# Patient Record
Sex: Female | Born: 1950 | Race: White | Hispanic: No | Marital: Married | Smoking: Never smoker
Health system: Southern US, Community
[De-identification: ages and names within clinical notes are randomized; demographics above are authoritative.]

## PROBLEM LIST (undated history)

## (undated) DIAGNOSIS — I773 Arterial fibromuscular dysplasia: Secondary | ICD-10-CM

## (undated) DIAGNOSIS — F329 Major depressive disorder, single episode, unspecified: Secondary | ICD-10-CM

## (undated) DIAGNOSIS — E7800 Pure hypercholesterolemia, unspecified: Secondary | ICD-10-CM

## (undated) DIAGNOSIS — K219 Gastro-esophageal reflux disease without esophagitis: Secondary | ICD-10-CM

## (undated) DIAGNOSIS — R079 Chest pain, unspecified: Secondary | ICD-10-CM

## (undated) DIAGNOSIS — M810 Age-related osteoporosis without current pathological fracture: Secondary | ICD-10-CM

## (undated) DIAGNOSIS — I10 Essential (primary) hypertension: Secondary | ICD-10-CM

## (undated) DIAGNOSIS — E0590 Thyrotoxicosis, unspecified without thyrotoxic crisis or storm: Secondary | ICD-10-CM

## (undated) DIAGNOSIS — M1990 Unspecified osteoarthritis, unspecified site: Secondary | ICD-10-CM

## (undated) DIAGNOSIS — F32A Depression, unspecified: Secondary | ICD-10-CM

## (undated) HISTORY — DX: Chest pain, unspecified: R07.9

## (undated) HISTORY — DX: Thyrotoxicosis, unspecified without thyrotoxic crisis or storm: E05.90

## (undated) HISTORY — PX: UPPER GI ENDOSCOPY: SHX6162

## (undated) HISTORY — PX: CARDIAC CATHETERIZATION: SHX172

## (undated) HISTORY — DX: Arterial fibromuscular dysplasia: I77.3

## (undated) HISTORY — PX: ABDOMINAL HYSTERECTOMY: SHX81

## (undated) HISTORY — DX: Pure hypercholesterolemia, unspecified: E78.00

## (undated) HISTORY — DX: Age-related osteoporosis without current pathological fracture: M81.0

## (undated) HISTORY — PX: APPENDECTOMY: SHX54

---
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
## 1998-11-02 ENCOUNTER — Other Ambulatory Visit: Admission: RE | Admit: 1998-11-02 | Discharge: 1998-11-02 | Payer: Self-pay | Admitting: Obstetrics and Gynecology

## 2000-08-19 ENCOUNTER — Inpatient Hospital Stay (HOSPITAL_COMMUNITY): Admission: RE | Admit: 2000-08-19 | Discharge: 2000-08-20 | Payer: Self-pay | Admitting: Obstetrics and Gynecology

## 2004-08-12 HISTORY — PX: ORIF WRIST FRACTURE: SHX2133

## 2006-10-10 ENCOUNTER — Encounter: Admission: RE | Admit: 2006-10-10 | Discharge: 2006-10-10 | Payer: Self-pay | Admitting: Cardiology

## 2006-11-10 ENCOUNTER — Encounter: Admission: RE | Admit: 2006-11-10 | Discharge: 2006-11-10 | Payer: Self-pay | Admitting: Gastroenterology

## 2007-04-24 ENCOUNTER — Ambulatory Visit (HOSPITAL_COMMUNITY): Admission: RE | Admit: 2007-04-24 | Discharge: 2007-04-24 | Payer: Self-pay | Admitting: Cardiology

## 2007-08-13 HISTORY — PX: OTHER SURGICAL HISTORY: SHX169

## 2008-10-19 ENCOUNTER — Encounter: Admission: RE | Admit: 2008-10-19 | Discharge: 2008-10-19 | Payer: Self-pay | Admitting: Obstetrics and Gynecology

## 2009-01-04 IMAGING — CT CT ANGIO ABDOMEN
4 of 8 series · 11 of 46 positions shown, 18 images · IV contrast ([ID] OMNI 350)
Comparison: None.

CLINICAL DATA: 55-year-old female, benign renovascular hypertension.  
CT ANGIOGRAPHY OF ABDOMEN:
TECHNIQUE: Multidetector CT imaging of the abdomen was performed during bolus injection of intravenous contrast.  Multiplanar CT angiographic image reconstructions were generated to evaluate the vascular anatomy.
Contrast:  125 cc Omnipaque 350

[Series 5: recon 2: renal/sma cta · axial · 0.64mm/px · z∈[-234,-94]mm · 5 of 119 slices shown, 10 images]
[im 20/119  soft-tissue]
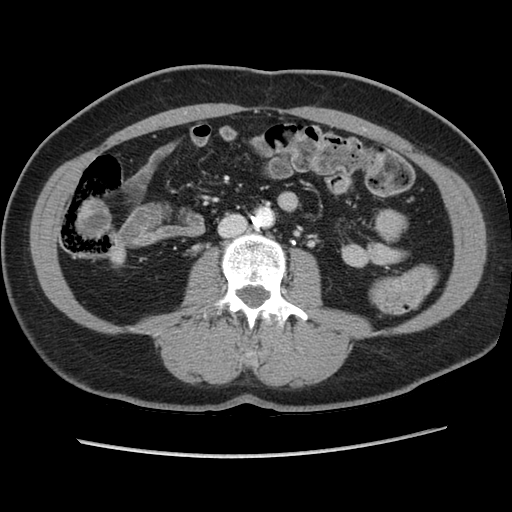
[im 20/119  bone]
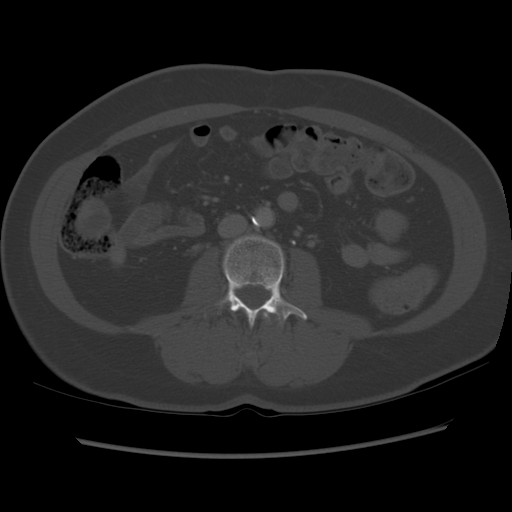
[im 40/119  soft-tissue]
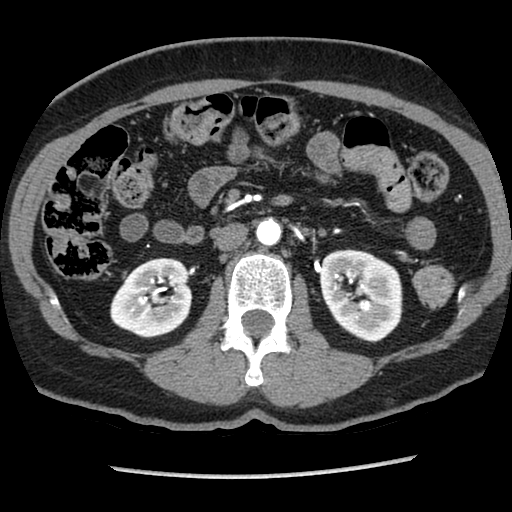
[im 40/119  lung]
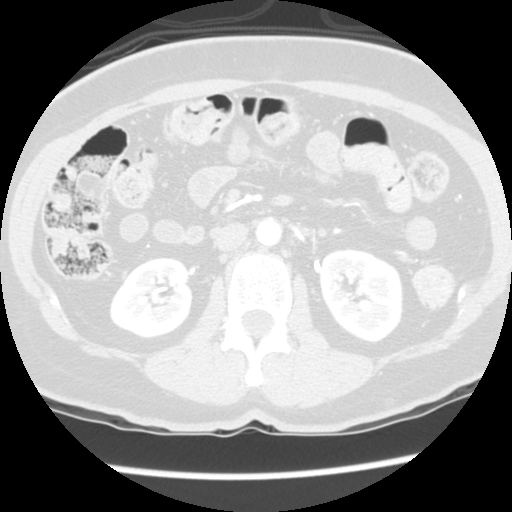
[im 60/119  soft-tissue]
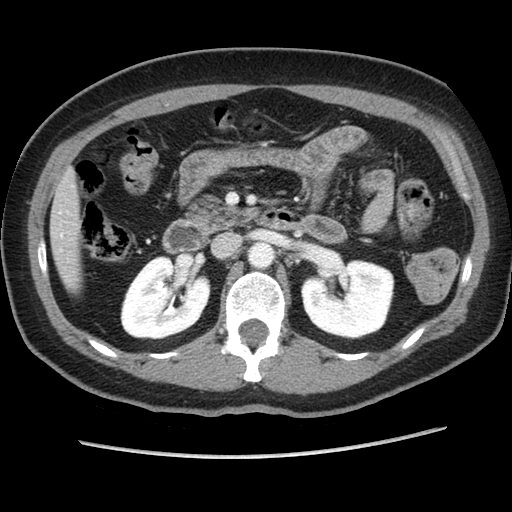
[im 60/119  lung]
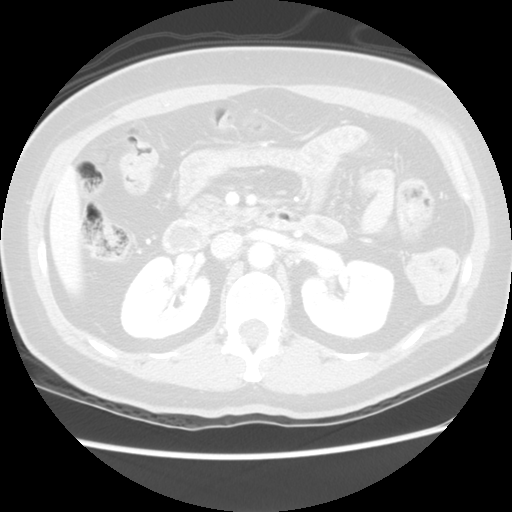
[im 79/119  soft-tissue]
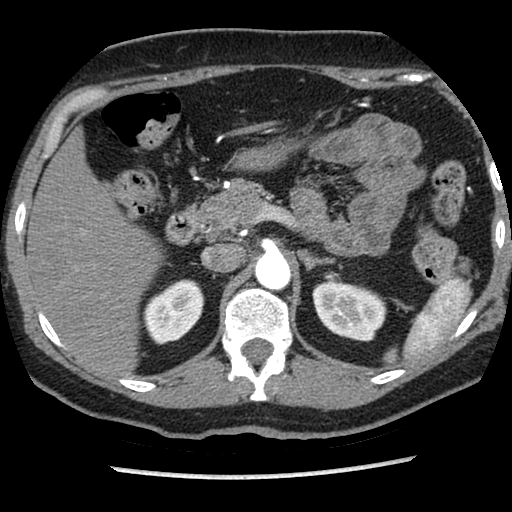
[im 79/119  lung]
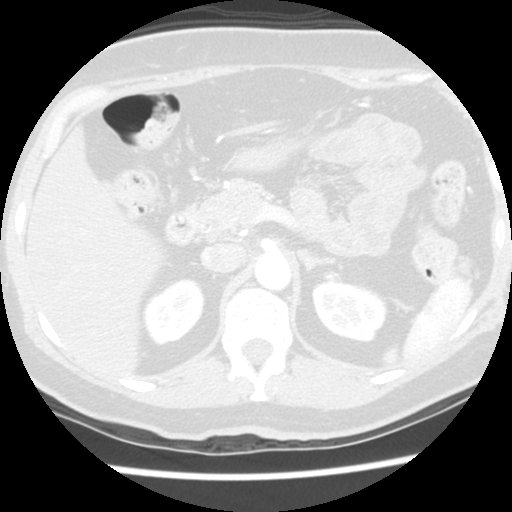
[im 99/119  soft-tissue]
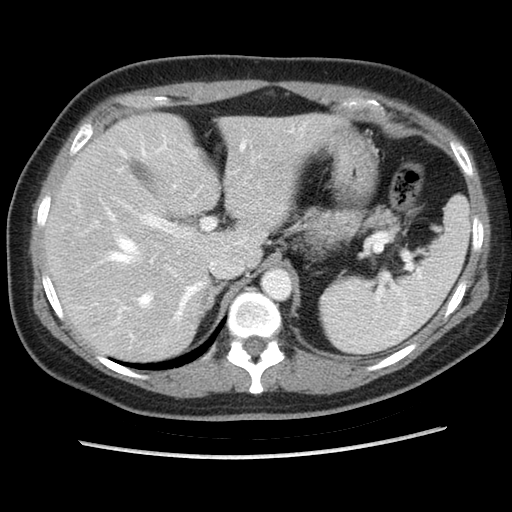
[im 99/119  lung]
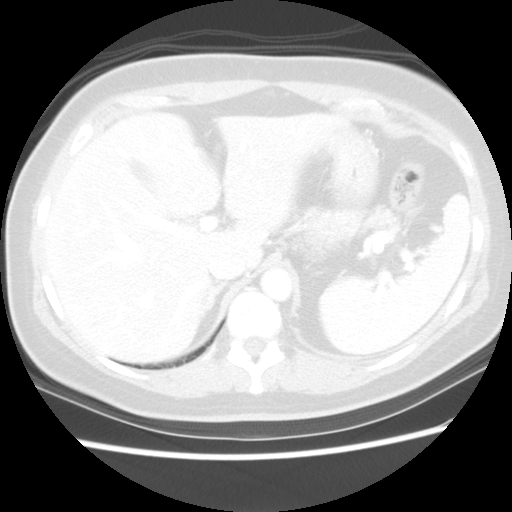

[Series 100: renal/sma cta · axial · 0.59mm/px · z∈[-244,-221]mm · 2 of 300 slices shown]
[im 19/300  soft-tissue]
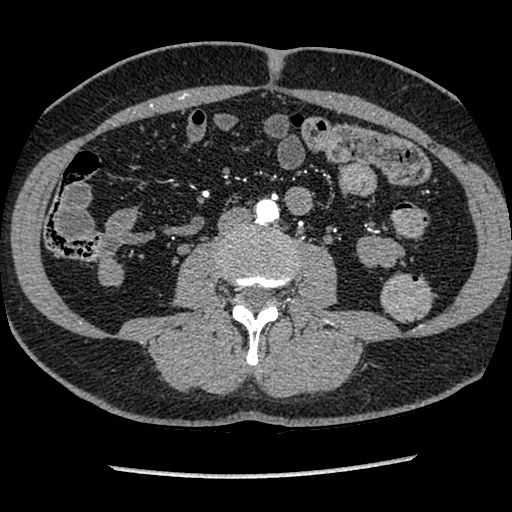
[im 57/300  soft-tissue]
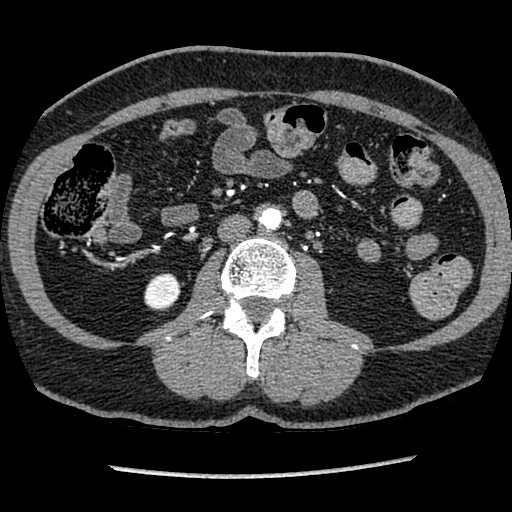

[Series 601: coronal angio · coronal · 0.59mm/px · 1 of 100 slices shown, 2 images]
[im 34/100  soft-tissue]
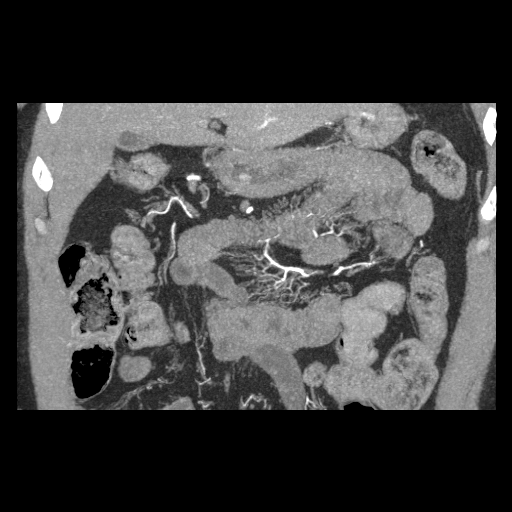
[im 34/100  bone]
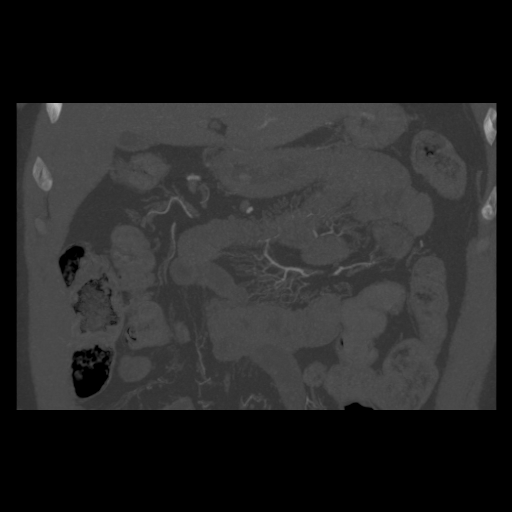

[Series 602: sagittal angio · sagittal · 0.59mm/px · 3 of 121 slices shown, 4 images]
[im 41/121  soft-tissue]
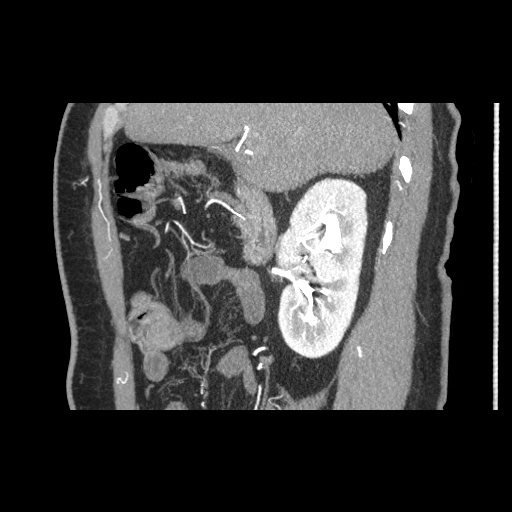
[im 54/121  soft-tissue]
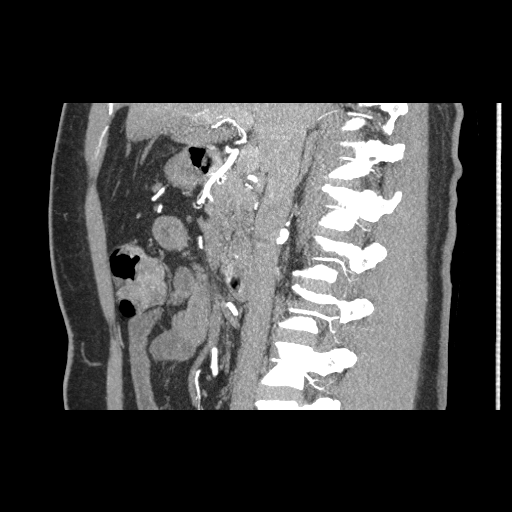
[im 54/121  bone]
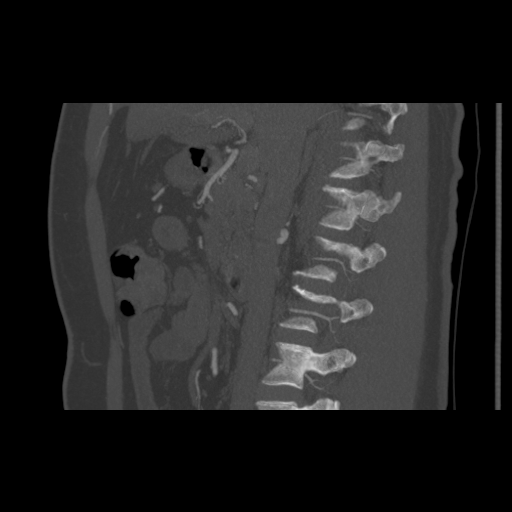
[im 67/121  soft-tissue]
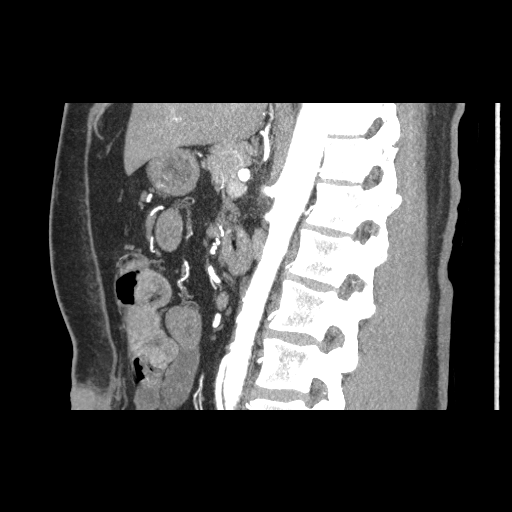

[11 of 46 positions shown; findings below may reference images not displayed]

FINDINGS: The abdominal aorta demonstrates minimal atherosclerotic calcification of the infrarenal segment extending to the bifurcation but no aneurysm, dissection, stenosis, or occlusion.  The celiac, superior mesenteric, and inferior mesenteric visceral arteries are all patent.  The superior mesenteric artery has a replaced right hepatic artery proximally, this is a normal variant.  
There are four total renal arteries, two on each side.  On the right side, there is a very tiny accessory right upper pole renal artery which is patent.  The main right renal artery has a patent ostium.  No proximal or ostial stenosis.  More distally, the main right renal artery has a ?minimally dilated and beaded? appearance extending to the bifurcation of the anterior and posterior divisions consistent with fibromuscular dysplasia of the right renal artery.  On the left side, the left main renal artery has a patent ostium with no proximal or ostial stenosis.  There is very subtle wall thickening and nodularity of the left renal artery main segment suspicious also for left FMD to a lesser degree.  There also is a lower pole left renal artery which is patent.  
The kidneys are symmetric in size with preserved cortex.  No acute hydronephrosis or obstruction.  No renal calculi appreciated.
Additional axial imaging of the abdomen demonstrates a 5 mm punctate hypodensity in the right hepatic dome, image 10 of series 5 too small to definitively characterize.  The gallbladder is partially collapsed.  The biliary system, adrenal glands, spleen, and pancreas are normal.  No bowel obstruction, abdominal adenopathy, free fluid, fluid collection, hemorrhage, or free air.  Degenerative change is present in the thoracic spine.  On the venous phase, the renal veins and IVC are patent.  Mesenteric and portal veins are patent.
IMPRESSION: 1.  Right renal artery fibromuscular dysplasia without a proximal or ostial stenosis.  
2.  Suspect left renal FMD as well to a lesser degree.  
3.  Patent celiac, superior mesenteric, and inferior mesenteric arteries. 
4.  Patent accessory renal vasculature bilaterally as described.
5.  Minimal aortic atherosclerosis but no aneurysm. 
6.   Punctate right hepatic hypodensity too small to definitively characterize.

## 2009-11-13 ENCOUNTER — Encounter: Admission: RE | Admit: 2009-11-13 | Discharge: 2009-11-13 | Payer: Self-pay | Admitting: Obstetrics and Gynecology

## 2010-09-02 ENCOUNTER — Encounter: Payer: Self-pay | Admitting: Gastroenterology

## 2010-09-04 ENCOUNTER — Ambulatory Visit
Admission: RE | Admit: 2010-09-04 | Payer: Self-pay | Source: Home / Self Care | Attending: Cardiology | Admitting: Cardiology

## 2010-09-09 NOTE — Procedures (Signed)
Alison [name], Alison [name] NO.:  [id number]  MEDICAL RECORD NO.:  [id number]          PATIENT TYPE:  OIB  LOCATION:  1966                         FACILITY:  [facility]  PHYSICIAN:  Armanda Magic, M.D.     DATE OF BIRTH:  1950-10-15  DATE OF PROCEDURE:  09/04/2010 DATE OF DISCHARGE:  09/04/2010                           CARDIAC CATHETERIZATION   REFERRING PHYSICIAN:  C. Duane Lope, MD  PROCEDURE:  Left heart catheterization, coronary angiography, left ventriculography.  OPERATOR:  Armanda Magic, MD  INDICATIONS:  Chest pain.  COMPLICATIONS:  None.  IV ACCESS:  Via right femoral artery, 4-French sheath.  INTRAVENOUS MEDICATIONS:  Versed 1 mg, fentanyl 25 mcg.  This is a 60 year old female with a history of hypertension, dyslipidemia, and chronic atypical chest pain with normal nuclear stress test a year ago, who presents with recurrent chest pain.  She recently had a health screen done with an EKG which was normal, 2-D echocardiogram was normal, and abdominal aorta and carotid arteries were normal.  She complains of persistent chest pain, described as a sensing up sensation in her midsternal area and radiates into her back between her shoulder blades and radiates down her left arm.  These symptoms have continued despite a normal nuclear stress test a year ago.  She says she is under a tremendous amount of stress.  She now presents for cardiac catheterization.  The patient was brought to the cardiac catheterization laboratory in the fasting nonsedated state.  Informed consent was obtained.  The patient was connected to continuous heart rate, pulse oximetry monitoring, intermittent blood pressure monitor.  The right groin was prepped and draped in sterile fashion.  Xylocaine 1% was used for local anesthesia. Using a modified Seldinger technique, a 4-French sheath was placed in right femoral artery.  Under fluoroscopic guidance, a 4-French JR-4 catheter was placed in  the right coronary artery.  Multiple cine films were taken in 30-degree RAO and 40-degree LAO views.  This catheter was then exchanged out over a guidewire for a 4-French 3D RCA catheter which successfully engaged the right coronary ostium.  Multiple cine films were taken in 30-degree RAO and 40-degree LAO views.  This catheter was then exchanged out over a guidewire for a 4-French angled pigtail catheter which was placed under fluoroscopic guidance in the left ventricular cavity.  Left ventriculography was performed in the 30- degree RAO view using a total of 0.5 mL contrast at 12 mL per second. The catheter was then pulled back across the aortic valve with no significant gradient noted.  At the end of the procedure, all catheters and sheaths were removed.  Manual compression was performed until adequate hemostasis was obtained.  The patient was transferred back to room in stable condition.  RESULTS:  The left main coronary artery is widely patent and trifurcates in the left anterior descending artery, left circumflex artery, andramus.  The ramus is a large artery which is widely patent.  The left anterior descending artery is widely patent throughout the course of the apex. It gives rise to 2 small-to-moderate-sized diagonals which were widely patent.  The left circumflex was widely  patent throughout the course of the AV groove, terminating in a large obtuse marginal branch.  Right coronary artery is widely patent throughout its course and distally bifurcates into posterior descending artery and posterolateral artery, both of which are widely patent.  Left ventriculography shows normal LV function, actually hyperdynamic with EF 70%, LV pressure 117/10 mmHg, aortic pressure 105/63 mmHg, LVEDP 16 mmHg.  ASSESSMENT: 1. Normal coronary arteries. 2. Normal left ventricular function. 3. Noncardiac chest pain.  PLAN:  Discharge to home after IV fluid and bedrest.  Follow up with  my nurse practitioner in 2 weeks for groin check.  Will follow up with her primary MD in 1-2 weeks for further workup of noncardiac chest pain.     Armanda Magic, M.D.     TT/MEDQ  D:  09/04/2010  T:  09/05/2010  Job:  [id number]  cc:   C. Duane Lope, M.D.  Electronically Signed by Armanda Magic M.D. on 09/09/2010 08:11:48 AM

## 2010-12-21 ENCOUNTER — Other Ambulatory Visit: Payer: Self-pay | Admitting: Family Medicine

## 2010-12-21 DIAGNOSIS — Z1231 Encounter for screening mammogram for malignant neoplasm of breast: Secondary | ICD-10-CM

## 2010-12-28 ENCOUNTER — Ambulatory Visit
Admission: RE | Admit: 2010-12-28 | Discharge: 2010-12-28 | Disposition: A | Discharge status: Home / Self Care | Payer: BC Managed Care – PPO | Source: Ambulatory Visit | Attending: Family Medicine | Admitting: Family Medicine

## 2010-12-28 DIAGNOSIS — Z1231 Encounter for screening mammogram for malignant neoplasm of breast: Secondary | ICD-10-CM

## 2011-12-13 ENCOUNTER — Other Ambulatory Visit: Payer: Self-pay | Admitting: Neurosurgery

## 2011-12-16 ENCOUNTER — Encounter (HOSPITAL_COMMUNITY): Payer: Self-pay | Admitting: Pharmacy Technician

## 2011-12-17 ENCOUNTER — Inpatient Hospital Stay (HOSPITAL_COMMUNITY): Admission: RE | Admit: 2011-12-17 | Payer: BC Managed Care – PPO | Source: Ambulatory Visit

## 2011-12-19 ENCOUNTER — Other Ambulatory Visit (HOSPITAL_COMMUNITY): Payer: BC Managed Care – PPO

## 2011-12-20 ENCOUNTER — Encounter (HOSPITAL_COMMUNITY)
Admission: RE | Admit: 2011-12-20 | Discharge: 2011-12-20 | Disposition: A | Discharge status: Home / Self Care | Payer: BC Managed Care – PPO | Source: Ambulatory Visit | Attending: Neurosurgery | Admitting: Neurosurgery

## 2011-12-20 ENCOUNTER — Ambulatory Visit (HOSPITAL_COMMUNITY)
Admission: RE | Admit: 2011-12-20 | Discharge: 2011-12-20 | Disposition: A | Discharge status: Home / Self Care | Payer: BC Managed Care – PPO | Source: Ambulatory Visit | Attending: Anesthesiology | Admitting: Anesthesiology

## 2011-12-20 ENCOUNTER — Encounter (HOSPITAL_COMMUNITY): Payer: Self-pay

## 2011-12-20 DIAGNOSIS — Z01818 Encounter for other preprocedural examination: Secondary | ICD-10-CM | POA: Insufficient documentation

## 2011-12-20 DIAGNOSIS — IMO0002 Reserved for concepts with insufficient information to code with codable children: Secondary | ICD-10-CM | POA: Insufficient documentation

## 2011-12-20 HISTORY — DX: Essential (primary) hypertension: I10

## 2011-12-20 HISTORY — DX: Unspecified osteoarthritis, unspecified site: M19.90

## 2011-12-20 LAB — CBC
HCT: 40.6 % (ref 36.0–46.0)
Hemoglobin: 14 g/dL (ref 12.0–15.0)
MCV: 87.9 fL (ref 78.0–100.0)
WBC: 5.5 10*3/uL (ref 4.0–10.5)

## 2011-12-20 LAB — BASIC METABOLIC PANEL
BUN: 12 mg/dL (ref 6–23)
CO2: 28 mEq/L (ref 19–32)
Chloride: 104 mEq/L (ref 96–112)
Creatinine, Ser: 0.69 mg/dL (ref 0.50–1.10)
GFR calc Af Amer: >90 mL/min (ref >90)
Potassium: 4 mEq/L (ref 3.5–5.1)

## 2011-12-20 LAB — SURGICAL PCR SCREEN
MRSA, PCR: NEGATIVE
Staphylococcus aureus: POSITIVE — AB

## 2011-12-20 NOTE — Pre-Procedure Instructions (Signed)
Alison [name]  12/20/2011   Your procedure is scheduled on:  May 13,2013 at 1150 AM  Report to [facility] at [phone] AM.  Call this number if you have problems the morning of surgery: [phone]   Remember:   Do not eat food:After Midnight.  May have clear liquids: up to 4 Hours before arrival.0550 AM  Clear liquids include soda, tea, black coffee, apple or grape juice, broth.  Take these medicines the morning of surgery with A SIP OF WATER Norco   Do not wear jewelry, make-up or nail polish.  Do not wear lotions, powders, or perfumes. You may wear deodorant.  Do not shave 48 hours prior to surgery.  Do not bring valuables to the hospital.  Contacts, dentures or bridgework may not be worn into surgery.  Leave suitcase in the car. After surgery it may be brought to your room.  For patients admitted to the hospital, checkout time is 11:00 AM the day of discharge.   Patients discharged the day of surgery will not be allowed to drive home.     Special Instructions: CHG Shower Use Special Wash: 1/2 bottle night before surgery and 1/2 bottle morning of surgery.   Please read over the following fact sheets that you were given: Pain Booklet, Coughing and Deep Breathing, MRSA Information and Surgical Site Infection Prevention

## 2011-12-20 NOTE — Progress Notes (Signed)
Cardiac cath done 08/23/10 was normal , stated chest pains was due to Mother passing.

## 2011-12-22 MED ORDER — CEFAZOLIN SODIUM 1-5 GM-% IV SOLN
1.0000 g | INTRAVENOUS | Status: DC
  Filled 2011-12-22: qty 50

## 2011-12-23 ENCOUNTER — Ambulatory Visit (HOSPITAL_COMMUNITY): Payer: BC Managed Care – PPO

## 2011-12-23 ENCOUNTER — Ambulatory Visit (HOSPITAL_COMMUNITY)
Admission: RE | Admit: 2011-12-23 | Discharge: 2011-12-24 | Disposition: A | Discharge status: Home / Self Care | Payer: BC Managed Care – PPO | Source: Ambulatory Visit | Attending: Neurosurgery | Admitting: Neurosurgery

## 2011-12-23 ENCOUNTER — Ambulatory Visit (HOSPITAL_COMMUNITY): Payer: BC Managed Care – PPO | Admitting: Anesthesiology

## 2011-12-23 ENCOUNTER — Encounter (HOSPITAL_COMMUNITY): Payer: Self-pay | Admitting: Anesthesiology

## 2011-12-23 ENCOUNTER — Encounter (HOSPITAL_COMMUNITY)
Admission: RE | Disposition: A | Discharge status: Home / Self Care | Payer: Self-pay | Source: Ambulatory Visit | Attending: Neurosurgery

## 2011-12-23 ENCOUNTER — Encounter (HOSPITAL_COMMUNITY): Payer: Self-pay | Admitting: *Deleted

## 2011-12-23 DIAGNOSIS — M5126 Other intervertebral disc displacement, lumbar region: Secondary | ICD-10-CM | POA: Insufficient documentation

## 2011-12-23 DIAGNOSIS — I10 Essential (primary) hypertension: Secondary | ICD-10-CM | POA: Insufficient documentation

## 2011-12-23 HISTORY — PX: LUMBAR LAMINECTOMY/DECOMPRESSION MICRODISCECTOMY: SHX5026

## 2011-12-23 SURGERY — LUMBAR LAMINECTOMY/DECOMPRESSION MICRODISCECTOMY 1 LEVEL
Anesthesia: General | Site: Spine Lumbar | Laterality: Right | Wound class: Clean

## 2011-12-23 MED ORDER — MUPIROCIN 2 % EX OINT
TOPICAL_OINTMENT | Freq: Two times a day (BID) | CUTANEOUS | Status: DC
  Filled 2011-12-23: qty 22

## 2011-12-23 MED ORDER — HEMOSTATIC AGENTS (NO CHARGE) OPTIME
TOPICAL | Status: DC | PRN
  Administered 2011-12-23: 1 via TOPICAL

## 2011-12-23 MED ORDER — BUPIVACAINE HCL (PF) 0.5 % IJ SOLN
INTRAMUSCULAR | Status: DC | PRN
  Administered 2011-12-23: 20 mL

## 2011-12-23 MED ORDER — BISACODYL 10 MG RE SUPP: 10.0000 mg | Freq: Every day | RECTAL | Status: DC | PRN

## 2011-12-23 MED ORDER — MENTHOL 3 MG MT LOZG: 1.0000 | LOZENGE | OROMUCOSAL | Status: DC | PRN

## 2011-12-23 MED ORDER — ONDANSETRON HCL 4 MG/2ML IJ SOLN: 4.0000 mg | Freq: Once | INTRAMUSCULAR | Status: DC | PRN

## 2011-12-23 MED ORDER — LIDOCAINE-EPINEPHRINE 1 %-1:100000 IJ SOLN
INTRAMUSCULAR | Status: DC | PRN
  Administered 2011-12-23: 20 mL

## 2011-12-23 MED ORDER — ROCURONIUM BROMIDE 100 MG/10ML IV SOLN
INTRAVENOUS | Status: DC | PRN
  Administered 2011-12-23: 50 mg via INTRAVENOUS

## 2011-12-23 MED ORDER — VANCOMYCIN HCL IN DEXTROSE 1-5 GM/200ML-% IV SOLN
1000.0000 mg | Freq: Once | INTRAVENOUS | Status: AC
  Administered 2011-12-23: 1000 mg via INTRAVENOUS
  Filled 2011-12-23: qty 200

## 2011-12-23 MED ORDER — BENAZEPRIL HCL 10 MG PO TABS
10.0000 mg | ORAL_TABLET | Freq: Every day | ORAL | Status: DC
  Filled 2011-12-23 (×2): qty 1

## 2011-12-23 MED ORDER — GENTAMICIN SULFATE 40 MG/ML IJ SOLN
INTRAVENOUS | Status: DC | PRN
  Administered 2011-12-23: 100 mL via INTRAVENOUS

## 2011-12-23 MED ORDER — ACETAMINOPHEN 650 MG RE SUPP: 650.0000 mg | RECTAL | Status: DC | PRN

## 2011-12-23 MED ORDER — HYDROMORPHONE HCL PF 1 MG/ML IJ SOLN
INTRAMUSCULAR | Status: AC
  Filled 2011-12-23: qty 1

## 2011-12-23 MED ORDER — KCL IN DEXTROSE-NACL 20-5-0.45 MEQ/L-%-% IV SOLN
INTRAVENOUS | Status: DC
  Filled 2011-12-23 (×4): qty 1000

## 2011-12-23 MED ORDER — HYDROXYZINE HCL 50 MG/ML IM SOLN: 50.0000 mg | INTRAMUSCULAR | Status: DC | PRN

## 2011-12-23 MED ORDER — SODIUM CHLORIDE 0.9 % IV SOLN
INTRAVENOUS | Status: AC
  Filled 2011-12-23: qty 500

## 2011-12-23 MED ORDER — SODIUM CHLORIDE 0.9 % IJ SOLN: 3.0000 mL | Freq: Two times a day (BID) | INTRAMUSCULAR | Status: DC

## 2011-12-23 MED ORDER — PHENOL 1.4 % MT LIQD: 1.0000 | OROMUCOSAL | Status: DC | PRN

## 2011-12-23 MED ORDER — SODIUM CHLORIDE 0.9 % IJ SOLN: 3.0000 mL | INTRAMUSCULAR | Status: DC | PRN

## 2011-12-23 MED ORDER — MORPHINE SULFATE 4 MG/ML IJ SOLN: 4.0000 mg | INTRAMUSCULAR | Status: DC | PRN

## 2011-12-23 MED ORDER — PHENYLEPHRINE HCL 10 MG/ML IJ SOLN
INTRAMUSCULAR | Status: DC | PRN
  Administered 2011-12-23: 80 ug via INTRAVENOUS
  Administered 2011-12-23: 40 ug via INTRAVENOUS
  Administered 2011-12-23 (×3): 80 ug via INTRAVENOUS

## 2011-12-23 MED ORDER — AMLODIPINE BESYLATE 5 MG PO TABS
5.0000 mg | ORAL_TABLET | Freq: Every day | ORAL | Status: DC
  Filled 2011-12-23 (×2): qty 1

## 2011-12-23 MED ORDER — GENTAMICIN IN SALINE 1.6-0.9 MG/ML-% IV SOLN
80.0000 mg | INTRAVENOUS | Status: DC
  Filled 2011-12-23: qty 50

## 2011-12-23 MED ORDER — ACETAMINOPHEN 325 MG PO TABS: 650.0000 mg | ORAL_TABLET | ORAL | Status: DC | PRN

## 2011-12-23 MED ORDER — ZOLPIDEM TARTRATE 5 MG PO TABS: 10.0000 mg | ORAL_TABLET | Freq: Every evening | ORAL | Status: DC | PRN

## 2011-12-23 MED ORDER — OXYCODONE-ACETAMINOPHEN 5-325 MG PO TABS
1.0000 | ORAL_TABLET | ORAL | Status: DC | PRN
  Administered 2011-12-23 (×2): 1 via ORAL
  Filled 2011-12-23: qty 1

## 2011-12-23 MED ORDER — KETOROLAC TROMETHAMINE 30 MG/ML IJ SOLN
30.0000 mg | Freq: Four times a day (QID) | INTRAMUSCULAR | Status: DC
  Administered 2011-12-24 (×2): 30 mg via INTRAVENOUS
  Filled 2011-12-23 (×6): qty 1

## 2011-12-23 MED ORDER — MIDAZOLAM HCL 5 MG/5ML IJ SOLN
INTRAMUSCULAR | Status: DC | PRN
  Administered 2011-12-23: 2 mg via INTRAVENOUS

## 2011-12-23 MED ORDER — METHYLPREDNISOLONE ACETATE 80 MG/ML IJ SUSP
INTRAMUSCULAR | Status: DC | PRN
  Administered 2011-12-23: 80 mg

## 2011-12-23 MED ORDER — MORPHINE SULFATE 4 MG/ML IJ SOLN: 0.0500 mg/kg | INTRAMUSCULAR | Status: DC | PRN

## 2011-12-23 MED ORDER — ALUM & MAG HYDROXIDE-SIMETH 200-200-20 MG/5ML PO SUSP: 30.0000 mL | Freq: Four times a day (QID) | ORAL | Status: DC | PRN

## 2011-12-23 MED ORDER — 0.9 % SODIUM CHLORIDE (POUR BTL) OPTIME
TOPICAL | Status: DC | PRN
  Administered 2011-12-23: 1000 mL

## 2011-12-23 MED ORDER — FENTANYL CITRATE 0.05 MG/ML IJ SOLN
INTRAMUSCULAR | Status: AC
  Filled 2011-12-23: qty 2

## 2011-12-23 MED ORDER — LACTATED RINGERS IV SOLN
INTRAVENOUS | Status: DC | PRN
  Administered 2011-12-23 (×2): via INTRAVENOUS

## 2011-12-23 MED ORDER — AMLODIPINE BESY-BENAZEPRIL HCL 5-10 MG PO CAPS: 1.0000 | ORAL_CAPSULE | Freq: Every day | ORAL | Status: DC

## 2011-12-23 MED ORDER — ATORVASTATIN CALCIUM 20 MG PO TABS
20.0000 mg | ORAL_TABLET | Freq: Every day | ORAL | Status: DC
  Filled 2011-12-23 (×2): qty 1

## 2011-12-23 MED ORDER — SODIUM CHLORIDE 0.9 % IR SOLN
Status: DC | PRN
  Administered 2011-12-23: 14:00:00

## 2011-12-23 MED ORDER — HYDROMORPHONE HCL PF 1 MG/ML IJ SOLN
0.2500 mg | INTRAMUSCULAR | Status: DC | PRN
  Administered 2011-12-23 (×3): 0.5 mg via INTRAVENOUS

## 2011-12-23 MED ORDER — HYDROXYZINE HCL 25 MG PO TABS: 50.0000 mg | ORAL_TABLET | ORAL | Status: DC | PRN

## 2011-12-23 MED ORDER — BACITRACIN 50000 UNITS IM SOLR
INTRAMUSCULAR | Status: AC
  Filled 2011-12-23: qty 1

## 2011-12-23 MED ORDER — ONDANSETRON HCL 4 MG/2ML IJ SOLN
INTRAMUSCULAR | Status: DC | PRN
  Administered 2011-12-23: 4 mg via INTRAVENOUS

## 2011-12-23 MED ORDER — OXYCODONE-ACETAMINOPHEN 5-325 MG PO TABS
ORAL_TABLET | ORAL | Status: AC
  Filled 2011-12-23: qty 2

## 2011-12-23 MED ORDER — NEOSTIGMINE METHYLSULFATE 1 MG/ML IJ SOLN
INTRAMUSCULAR | Status: DC | PRN
  Administered 2011-12-23: 3 mg via INTRAVENOUS

## 2011-12-23 MED ORDER — KETOROLAC TROMETHAMINE 30 MG/ML IJ SOLN
INTRAMUSCULAR | Status: AC
  Filled 2011-12-23: qty 1

## 2011-12-23 MED ORDER — KETOROLAC TROMETHAMINE 30 MG/ML IJ SOLN
30.0000 mg | Freq: Once | INTRAMUSCULAR | Status: AC
  Administered 2011-12-23: 30 mg via INTRAVENOUS

## 2011-12-23 MED ORDER — PROPOFOL 10 MG/ML IV EMUL
INTRAVENOUS | Status: DC | PRN
  Administered 2011-12-23: 150 mg via INTRAVENOUS

## 2011-12-23 MED ORDER — HYDROCODONE-ACETAMINOPHEN 5-325 MG PO TABS: 1.0000 | ORAL_TABLET | ORAL | Status: DC | PRN

## 2011-12-23 MED ORDER — MAGNESIUM HYDROXIDE 400 MG/5ML PO SUSP: 30.0000 mL | Freq: Every day | ORAL | Status: DC | PRN

## 2011-12-23 MED ORDER — GLYCOPYRROLATE 0.2 MG/ML IJ SOLN
INTRAMUSCULAR | Status: DC | PRN
  Administered 2011-12-23: .4 mg via INTRAVENOUS

## 2011-12-23 MED ORDER — FENTANYL CITRATE 0.05 MG/ML IJ SOLN
INTRAMUSCULAR | Status: DC | PRN
  Administered 2011-12-23: 50 ug via INTRAVENOUS
  Administered 2011-12-23: 100 ug via INTRAVENOUS
  Administered 2011-12-23 (×2): 50 ug via INTRAVENOUS

## 2011-12-23 MED ORDER — LIDOCAINE HCL (CARDIAC) 20 MG/ML IV SOLN
INTRAVENOUS | Status: DC | PRN
  Administered 2011-12-23: 50 mg via INTRAVENOUS

## 2011-12-23 MED ORDER — THROMBIN 5000 UNITS EX KIT
PACK | CUTANEOUS | Status: DC | PRN
  Administered 2011-12-23 (×2): 5000 [IU] via TOPICAL

## 2011-12-23 MED ORDER — CYCLOBENZAPRINE HCL 10 MG PO TABS: 10.0000 mg | ORAL_TABLET | Freq: Three times a day (TID) | ORAL | Status: DC | PRN

## 2011-12-23 MED ORDER — FENTANYL CITRATE 0.05 MG/ML IJ SOLN
INTRAMUSCULAR | Status: DC | PRN
  Administered 2011-12-23: 50 ug via INTRAVENOUS

## 2011-12-23 SURGICAL SUPPLY — 62 items
BAG DECANTER FOR FLEXI CONT (MISCELLANEOUS) ×2 IMPLANT
BENZOIN TINCTURE PRP APPL 2/3 (GAUZE/BANDAGES/DRESSINGS) IMPLANT
BLADE SURG ROTATE 9660 (MISCELLANEOUS) IMPLANT
BRUSH SCRUB EZ PLAIN DRY (MISCELLANEOUS) ×2 IMPLANT
BUR ACORN 6.0 ACORN (BURR) IMPLANT
BUR ACRON 5.0MM COATED (BURR) ×2 IMPLANT
BUR MATCHSTICK NEURO 3.0 LAGG (BURR) ×2 IMPLANT
CANISTER SUCTION 2500CC (MISCELLANEOUS) ×2 IMPLANT
CLOTH BEACON ORANGE TIMEOUT ST (SAFETY) ×2 IMPLANT
CONT SPEC 4OZ CLIKSEAL STRL BL (MISCELLANEOUS) ×2 IMPLANT
DERMABOND ADHESIVE PROPEN (GAUZE/BANDAGES/DRESSINGS) ×1
DERMABOND ADVANCED (GAUZE/BANDAGES/DRESSINGS)
DERMABOND ADVANCED .7 DNX12 (GAUZE/BANDAGES/DRESSINGS) IMPLANT
DERMABOND ADVANCED .7 DNX6 (GAUZE/BANDAGES/DRESSINGS) ×1 IMPLANT
DRAPE LAPAROTOMY 100X72X124 (DRAPES) ×2 IMPLANT
DRAPE MICROSCOPE LEICA (MISCELLANEOUS) ×2 IMPLANT
DRAPE POUCH INSTRU U-SHP 10X18 (DRAPES) ×2 IMPLANT
DRSG EMULSION OIL 3X3 NADH (GAUZE/BANDAGES/DRESSINGS) IMPLANT
ELECT REM PT RETURN 9FT ADLT (ELECTROSURGICAL) ×2
ELECTRODE REM PT RTRN 9FT ADLT (ELECTROSURGICAL) ×1 IMPLANT
GAUZE SPONGE 4X4 16PLY XRAY LF (GAUZE/BANDAGES/DRESSINGS) IMPLANT
GLOVE BIO SURGEON STRL SZ7 (GLOVE) ×2 IMPLANT
GLOVE BIO SURGEON STRL SZ8 (GLOVE) ×2 IMPLANT
GLOVE BIOGEL PI IND STRL 7.0 (GLOVE) ×1 IMPLANT
GLOVE BIOGEL PI IND STRL 8 (GLOVE) ×1 IMPLANT
GLOVE BIOGEL PI INDICATOR 7.0 (GLOVE) ×1
GLOVE BIOGEL PI INDICATOR 8 (GLOVE) ×1
GLOVE ECLIPSE 7.5 STRL STRAW (GLOVE) ×2 IMPLANT
GLOVE EXAM NITRILE LRG STRL (GLOVE) IMPLANT
GLOVE EXAM NITRILE MD LF STRL (GLOVE) IMPLANT
GLOVE EXAM NITRILE XL STR (GLOVE) IMPLANT
GLOVE EXAM NITRILE XS STR PU (GLOVE) IMPLANT
GLOVE INDICATOR 8.5 STRL (GLOVE) ×2 IMPLANT
GLOVE SURG SS PI 6.5 STRL IVOR (GLOVE) ×4 IMPLANT
GOWN BRE IMP SLV AUR LG STRL (GOWN DISPOSABLE) ×2 IMPLANT
GOWN BRE IMP SLV AUR XL STRL (GOWN DISPOSABLE) ×2 IMPLANT
GOWN STRL REIN 2XL LVL4 (GOWN DISPOSABLE) IMPLANT
KIT BASIN OR (CUSTOM PROCEDURE TRAY) ×2 IMPLANT
KIT ROOM TURNOVER OR (KITS) ×2 IMPLANT
NEEDLE HYPO 18GX1.5 BLUNT FILL (NEEDLE) IMPLANT
NEEDLE SPNL 18GX3.5 QUINCKE PK (NEEDLE) ×2 IMPLANT
NEEDLE SPNL 22GX3.5 QUINCKE BK (NEEDLE) ×2 IMPLANT
NS IRRIG 1000ML POUR BTL (IV SOLUTION) ×2 IMPLANT
PACK LAMINECTOMY NEURO (CUSTOM PROCEDURE TRAY) ×2 IMPLANT
PAD ARMBOARD 7.5X6 YLW CONV (MISCELLANEOUS) ×10 IMPLANT
PATTIES SURGICAL .5 X1 (DISPOSABLE) ×2 IMPLANT
PATTIES SURGICAL 1X1 (DISPOSABLE) IMPLANT
RUBBERBAND STERILE (MISCELLANEOUS) ×4 IMPLANT
SPONGE GAUZE 4X4 12PLY (GAUZE/BANDAGES/DRESSINGS) IMPLANT
SPONGE LAP 4X18 X RAY DECT (DISPOSABLE) IMPLANT
SPONGE SURGIFOAM ABS GEL SZ50 (HEMOSTASIS) ×2 IMPLANT
STRIP CLOSURE SKIN 1/2X4 (GAUZE/BANDAGES/DRESSINGS) IMPLANT
SUT PROLENE 6 0 BV (SUTURE) IMPLANT
SUT VIC AB 1 CT1 18XBRD ANBCTR (SUTURE) ×2 IMPLANT
SUT VIC AB 1 CT1 8-18 (SUTURE) ×2
SUT VIC AB 2-0 CP2 18 (SUTURE) ×2 IMPLANT
SUT VIC AB 3-0 SH 8-18 (SUTURE) ×2 IMPLANT
SYR 20CC LL (SYRINGE) ×2 IMPLANT
SYR 5ML LL (SYRINGE) IMPLANT
TOWEL OR 17X24 6PK STRL BLUE (TOWEL DISPOSABLE) ×2 IMPLANT
TOWEL OR 17X26 10 PK STRL BLUE (TOWEL DISPOSABLE) ×2 IMPLANT
WATER STERILE IRR 1000ML POUR (IV SOLUTION) ×2 IMPLANT

## 2011-12-23 NOTE — Progress Notes (Signed)
Filed Vitals:   12/23/11 1549 12/23/11 1553 12/23/11 1651 12/23/11 1700  BP:  108/68  118/76  Pulse: 60 61  79  Temp:   97 F (36.1 C) 97.5 F (36.4 C)  TempSrc:    Oral  Resp: 8 22  18   SpO2: 100% 100%  97%    Patient with good relief of radicular pain. From a shortly after eating, probably from a combination of pain medication and anesthesia. We'll continue to progress mobilization. Moving all 4 extremities well. Wound clean dry.  Plan: Continued to progress to postoperative recovery.  Hewitt Shorts, MD 12/23/2011, 7:10 PM

## 2011-12-23 NOTE — Anesthesia Preprocedure Evaluation (Signed)
Anesthesia Evaluation  Patient identified by MRN, date of birth, ID band Patient awake    Reviewed: Allergy & Precautions, H&P , Patient's Chart, lab work & pertinent test results  Airway Mallampati: I TM Distance: >3 FB Neck ROM: Full    Dental   Pulmonary          Cardiovascular hypertension, Pt. on medications     Neuro/Psych    GI/Hepatic   Endo/Other    Renal/GU      Musculoskeletal   Abdominal   Peds  Hematology   Anesthesia Other Findings   Reproductive/Obstetrics                           Anesthesia Physical Anesthesia Plan  ASA: II  Anesthesia Plan: General   Post-op Pain Management:    Induction: Intravenous  Airway Management Planned: Oral ETT  Additional Equipment:   Intra-op Plan:   Post-operative Plan: Extubation in OR  Informed Consent: I have reviewed the patients History and Physical, chart, labs and discussed the procedure including the risks, benefits and alternatives for the proposed anesthesia with the patient or authorized representative who has indicated his/her understanding and acceptance.     Plan Discussed with: CRNA and Surgeon  Anesthesia Plan Comments:         Anesthesia Quick Evaluation  

## 2011-12-23 NOTE — Progress Notes (Signed)
Dr. Newell Coral called about PCN allergy and Ancef ordered. Order modified per MD order.

## 2011-12-23 NOTE — Transfer of Care (Signed)
Immediate Anesthesia Transfer of Care Note  Patient: Alison [name]  Procedure(s) Performed: Procedure(s) (LRB): LUMBAR LAMINECTOMY/DECOMPRESSION MICRODISCECTOMY 1 LEVEL (Right)  Patient Location: PACU  Anesthesia Type: General  Level of Consciousness: awake, alert  and oriented  Airway & Oxygen Therapy: Patient Spontanous Breathing and Patient connected to nasal cannula oxygen  Post-op Assessment: Report given to PACU RN, Post -op Vital signs reviewed and stable, Patient moving all extremities and Patient moving all extremities X 4  Post vital signs: Reviewed and stable  Complications: No apparent anesthesia complications

## 2011-12-23 NOTE — Anesthesia Postprocedure Evaluation (Signed)
Anesthesia Post Note  Patient: Alison [name]  Procedure(s) Performed: Procedure(s) (LRB): LUMBAR LAMINECTOMY/DECOMPRESSION MICRODISCECTOMY 1 LEVEL (Right)  Anesthesia type: general  Patient location: PACU  Post pain: Pain level controlled  Post assessment: Patient's Cardiovascular Status Stable  Last Vitals:  Filed Vitals:   12/23/11 1553  BP: 108/68  Pulse: 61  Temp:   Resp: 22    Post vital signs: Reviewed and stable  Level of consciousness: sedated  Complications: No apparent anesthesia complications

## 2011-12-23 NOTE — Anesthesia Procedure Notes (Signed)
Procedure Name: Intubation Date/Time: 12/23/2011 1:14 PM Performed by: Luster Landsberg Pre-anesthesia Checklist: Patient identified, Emergency Drugs available, Suction available, Patient being monitored and Timeout performed Patient Re-evaluated:Patient Re-evaluated prior to inductionOxygen Delivery Method: Circle system utilized Preoxygenation: Pre-oxygenation with 100% oxygen Intubation Type: IV induction Ventilation: Mask ventilation without difficulty and Oral airway inserted - appropriate to patient size Laryngoscope Size: 3 and Mac Grade View: Grade I Tube type: Oral Tube size: 7.0 mm Airway Equipment and Method: Stylet Placement Confirmation: ETT inserted through vocal cords under direct vision,  positive ETCO2 and breath sounds checked- equal and bilateral Secured at: 22 cm Tube secured with: Tape Dental Injury: Teeth and Oropharynx as per pre-operative assessment

## 2011-12-23 NOTE — H&P (Signed)
Subjective: Patient is a 61 y.o. female who is admitted for treatment of right L2-3 lumbar disc herniation. Patient's had a history of backache for a few uterus. 6 weeks ago she developed pain in the right side of low back and etc. down to the lateral right hip, into the anterior right thigh and knee. It is associated with numbness, paresthesias, and dysesthesias. A time she feels that the weakness in the right lower joint.  She was treated with multiple steroid Dosepaks as well as narcotic analgesics without relief. She was worked up with the MRI that revealed a right L2-3 lumbar disc herniation with a free fragment has migrated rostrally and laterally behind the body of L2 and extending into the right L2-3 neural foramen, with significant compression of the exiting right L2 nerve root. She is admitted now for lumbar discectomy.    Past Medical History  Diagnosis Date  . Hypertension     on meds  . Arthritis     Past Surgical History  Procedure Date  . Cardiac catheterization     normal cath , chest pains due to stress  . Abdominal hysterectomy   . Orif wrist fracture 2006  . Appendectomy     1969  . Cesarean section 1985  . Colonscopy 2009    Prescriptions prior to admission  Medication Sig Dispense Refill  . amLODipine-benazepril (LOTREL) 5-10 MG per capsule Take 1 capsule by mouth daily.      Marland Kitchen aspirin EC 81 MG tablet Take 81 mg by mouth daily.      . Cholecalciferol (VITAMIN D-3 PO) Take 1 capsule by mouth daily.      Marland Kitchen HYDROcodone-acetaminophen (NORCO) 5-325 MG per tablet Take 0.5 tablets by mouth every 6 (six) hours as needed. For pain      . Multiple Vitamin (MULITIVITAMIN WITH MINERALS) TABS Take 1 tablet by mouth daily.      . niacin (NIASPAN) 500 MG CR tablet Take 1,000 mg by mouth daily.      Ethelda Chick (OYSTER CALCIUM) 500 MG TABS Take 500 mg of elemental calcium by mouth daily.      . rosuvastatin (CRESTOR) 10 MG tablet Take 10 mg by mouth daily.       Allergies    Allergen Reactions  . Penicillins Hives and Swelling    History  Substance Use Topics  . Smoking status: Never Smoker   . Smokeless tobacco: Never Used  . Alcohol Use: Yes     social    Family History  Problem Relation Age of Onset  . Anesthesia problems Neg Hx   . Hypotension Neg Hx   . Malignant hyperthermia Neg Hx   . Pseudochol deficiency Neg Hx      Review of Systems A comprehensive review of systems was negative.  Objective: Vital signs in last 24 hours: Temp:  [98.7 F (37.1 C)] 98.7 F (37.1 C) (05/13 0924) Pulse Rate:  [80] 80  (05/13 0924) Resp:  [18] 18  (05/13 0924) BP: (111)/(70) 111/70 mmHg (05/13 0924) SpO2:  [97 %] 97 % (05/13 0924)  EXAM: Patient is a well-developed well-nourished white female in no acute distress. Lungs are clear to auscultation , the patient has symmetrical respiratory excursion. Heart has a regular rate and rhythm normal S1 and S2 no murmur.   Abdomen is soft nontender nondistended bowel sounds are present. Extremity examination shows no clubbing cyanosis or edema. Musculoskeletal examination shows mild tenderness to palpation in the lumbar region without specific point  tenderness. She to flex BMET degrees and she is able to extend to 10. Straight leg raising is negative bilaterally. Motor examination shows 5 over 5 strength in the lower extremities including the iliopsoas quadriceps dorsiflexor extensor hallicus  longus and plantar flexor bilaterally. Sensation is intact to pinprick in the distal lower extremities. Reflexes are symmetrical bilaterally. No pathologic reflexes are present. Patient has a normal gait and stance.   Data Review:CBC    Component Value Date/Time   WBC 5.5 12/20/2011 0901   RBC 4.62 12/20/2011 0901   HGB 14.0 12/20/2011 0901   HCT 40.6 12/20/2011 0901   PLT 160 12/20/2011 0901   MCV 87.9 12/20/2011 0901   MCH 30.3 12/20/2011 0901   MCHC 34.5 12/20/2011 0901   RDW 12.6 12/20/2011 0901                           BMET    Component Value Date/Time   NA 141 12/20/2011 0901   K 4.0 12/20/2011 0901   CL 104 12/20/2011 0901   CO2 28 12/20/2011 0901   GLUCOSE 96 12/20/2011 0901   BUN 12 12/20/2011 0901   CREATININE 0.69 12/20/2011 0901   CALCIUM 9.4 12/20/2011 0901   GFRNONAA >90 12/20/2011 0901   GFRAA >90 12/20/2011 0901     Assessment/Plan: Patient is admitted for a right L2-3 lumbar laminotomy and microdiscectomy. I've discussed with the patient the nature of his condition, the nature the surgical procedure, the typical length of surgery, hospital stay, and overall recuperation. We discussed limitations postoperatively. I discussed risks of surgery including risks of infection, bleeding, possibly need for transfusion, the risk of nerve root dysfunction with pain, weakness, numbness, or paresthesias, or risk of dural tear and CSF leakage and possible need for further surgery, the risk of recurrent disc herniation and the possible need for further surgery, and the risk of anesthetic complications including myocardial infarction, stroke, pneumonia, and death. Understanding all this the patient does wish to proceed with surgery and is admitted for such.    Hewitt Shorts, MD 12/23/2011 12:59 PM

## 2011-12-23 NOTE — Op Note (Signed)
12/23/2011  2:42 PM  PATIENT:  Alison [name]  61 y.o. 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  ANESTHESIA:   general  EBL:  Total I/O In: 1000 [I.V.:1000] Out: -   BLOOD ADMINISTERED:none  COUNT: Correct per nursing staff  DICTATION: Patient was brought to the operating room and placed under general endotracheal anesthesia. Patient was turned to prone position the lumbar region was prepped with Betadine soap and solution and draped in a sterile fashion. The midline was infiltrated with local anesthetic with epinephrine. A localizing x-ray was taken and the L2-3 level was identified. Midline incision was made over the L2-3 level and was carried down through the subcutaneous tissue to the lumbar fascia. The lumbar fascia was incised on the right side and the paraspinal muscles were dissected from the spinous processes and lamina in a subperiosteal fashion. Another x-ray was taken and the L2-3 intralaminar space was identified. Laminotomy was performed using the high-speed drill and Kerrison punches. The ligamentum flavum was carefully resected. The underlying thecal sac and nerve root were identified. The disc herniation was identified and the thecal sac and nerve root gently retracted medially. We identified the disc herniation laterally within the right L2-3 neural foramen. It was free fragment. He was removed in a piecemeal fashion, mobilizing it with a hook, and removing  the fragments with a pituitary rongeur. Once the discectomy was completed and good decompression of the thecal sac and nerve had been achieved hemostasis was established with the use of bipolar cautery and Gelfoam with thrombin. The Gelfoam was removed and hemostasis confirmed. We then instilled 2 cc of fentanyl and 80 mg of Depo-Medrol into the epidural space. Deep fascia was closed with interrupted undyed 1 Vicryl sutures. Scarpa's fascia was closed with interrupted undyed 1 Vicryl sutures in the subcutaneous and subcuticular layer were closed with interrupted inverted 2-0 undyed Vicryl sutures. The skin edges were approximated with Dermabond. Following surgery the patient was turned back to a supine position to be reversed from the anesthetic extubated and transferred to the recovery room for further care.   PLAN OF CARE: Admit for overnight observation  PATIENT DISPOSITION:  PACU - hemodynamically stable.   Delay start of Pharmacological VTE agent (>24hrs) due to surgical blood loss or risk of bleeding:  yes

## 2011-12-24 ENCOUNTER — Encounter (HOSPITAL_COMMUNITY): Payer: Self-pay | Admitting: Neurosurgery

## 2011-12-24 MED ORDER — OXYCODONE-ACETAMINOPHEN 5-325 MG PO TABS: 1.0000 | ORAL_TABLET | ORAL | Status: AC | PRN

## 2011-12-24 NOTE — Discharge Summary (Signed)
Physician Discharge Summary  Patient ID: Alison [name] MRN: [id number] DOB/AGE: 1950/12/14 61 y.o.  Admit date: 12/23/2011 Discharge date: 12/24/2011  Admission Diagnoses: Right L2-3 lumbar herniated disc, lumbar degenerative disease, lumbar spondylosis, lumbar radiculopathy  Discharge Diagnoses: Right L2-3 lumbar herniated disc, lumbar degenerative disease, lumbar spondylosis, lumbar radiculopathy  Discharged Condition: good  Hospital Course: Patient was admitted underwent a right L2 hemilaminotomy and right L2-3 microdiscectomy. She is done well with good relief of her radicular pain. However she still has some numbness in the anterior right thigh. Her incision is healing nicely. There is no erythema, swelling, or drainage. She is up and any actively in the halls. She is asking to be discharged. We have given her instructions regarding wound care and activities. She is to return for followup with me in 3 weeks.  Discharge Exam: Blood pressure 97/52, pulse 66, temperature 98.5 F (36.9 C), temperature source Oral, resp. rate 18, SpO2 95.00%.  Disposition: Home   Medication List  As of 12/24/2011  8:23 AM   TAKE these medications         amLODipine-benazepril 5-10 MG per capsule   Commonly known as: LOTREL   Take 1 capsule by mouth daily.      aspirin EC 81 MG tablet   Take 81 mg by mouth daily.      HYDROcodone-acetaminophen 5-325 MG per tablet   Commonly known as: NORCO   Take 0.5 tablets by mouth every 6 (six) hours as needed. For pain      mulitivitamin with minerals Tabs   Take 1 tablet by mouth daily.      niacin 500 MG CR tablet   Commonly known as: NIASPAN   Take 1,000 mg by mouth daily.      oxyCODONE-acetaminophen 5-325 MG per tablet   Commonly known as: PERCOCET   Take 1-2 tablets by mouth every 4 (four) hours as needed for pain.      oyster calcium 500 MG Tabs   Take 500 mg of elemental calcium by mouth daily.      rosuvastatin 10 MG tablet   Commonly  known as: CRESTOR   Take 10 mg by mouth daily.      VITAMIN D-3 PO   Take 1 capsule by mouth daily.             Signed: Hewitt Shorts, MD 12/24/2011, 8:23 AM

## 2011-12-24 NOTE — Discharge Instructions (Signed)

## 2012-06-25 ENCOUNTER — Other Ambulatory Visit: Payer: Self-pay | Admitting: Obstetrics & Gynecology

## 2012-06-25 DIAGNOSIS — Z1231 Encounter for screening mammogram for malignant neoplasm of breast: Secondary | ICD-10-CM

## 2012-08-03 ENCOUNTER — Ambulatory Visit: Payer: BC Managed Care – PPO

## 2012-09-03 ENCOUNTER — Ambulatory Visit: Payer: BC Managed Care – PPO

## 2012-09-08 ENCOUNTER — Ambulatory Visit
Admission: RE | Admit: 2012-09-08 | Discharge: 2012-09-08 | Disposition: A | Discharge status: Home / Self Care | Payer: BC Managed Care – PPO | Source: Ambulatory Visit | Attending: Obstetrics & Gynecology | Admitting: Obstetrics & Gynecology

## 2012-09-08 DIAGNOSIS — Z1231 Encounter for screening mammogram for malignant neoplasm of breast: Secondary | ICD-10-CM

## 2013-09-03 ENCOUNTER — Other Ambulatory Visit: Payer: Self-pay

## 2013-09-03 DIAGNOSIS — Z1231 Encounter for screening mammogram for malignant neoplasm of breast: Secondary | ICD-10-CM

## 2013-09-20 ENCOUNTER — Ambulatory Visit: Payer: BC Managed Care – PPO

## 2013-10-06 ENCOUNTER — Ambulatory Visit
Admission: RE | Admit: 2013-10-06 | Discharge: 2013-10-06 | Disposition: A | Discharge status: Home / Self Care | Payer: BC Managed Care – PPO | Source: Ambulatory Visit

## 2013-10-06 DIAGNOSIS — Z1231 Encounter for screening mammogram for malignant neoplasm of breast: Secondary | ICD-10-CM

## 2014-03-17 ENCOUNTER — Encounter: Payer: Self-pay | Admitting: Cardiology

## 2014-03-17 ENCOUNTER — Encounter: Payer: Self-pay | Admitting: *Deleted

## 2014-03-17 DIAGNOSIS — I10 Essential (primary) hypertension: Secondary | ICD-10-CM | POA: Insufficient documentation

## 2014-03-17 DIAGNOSIS — M1990 Unspecified osteoarthritis, unspecified site: Secondary | ICD-10-CM | POA: Insufficient documentation

## 2014-03-17 DIAGNOSIS — I773 Arterial fibromuscular dysplasia: Secondary | ICD-10-CM | POA: Insufficient documentation

## 2014-03-17 DIAGNOSIS — R079 Chest pain, unspecified: Secondary | ICD-10-CM | POA: Insufficient documentation

## 2014-03-17 DIAGNOSIS — M810 Age-related osteoporosis without current pathological fracture: Secondary | ICD-10-CM | POA: Insufficient documentation

## 2014-03-17 DIAGNOSIS — E7800 Pure hypercholesterolemia, unspecified: Secondary | ICD-10-CM | POA: Insufficient documentation

## 2014-12-14 ENCOUNTER — Other Ambulatory Visit: Payer: Self-pay | Admitting: Obstetrics & Gynecology

## 2016-05-31 ENCOUNTER — Other Ambulatory Visit: Payer: Self-pay | Admitting: Obstetrics & Gynecology

## 2016-05-31 DIAGNOSIS — Z1231 Encounter for screening mammogram for malignant neoplasm of breast: Secondary | ICD-10-CM

## 2016-06-25 ENCOUNTER — Ambulatory Visit
Admission: RE | Admit: 2016-06-25 | Discharge: 2016-06-25 | Disposition: A | Discharge status: Home / Self Care | Payer: BLUE CROSS/BLUE SHIELD | Source: Ambulatory Visit | Attending: Obstetrics & Gynecology | Admitting: Obstetrics & Gynecology

## 2016-06-25 DIAGNOSIS — Z1231 Encounter for screening mammogram for malignant neoplasm of breast: Secondary | ICD-10-CM

## 2016-07-15 ENCOUNTER — Telehealth: Payer: Self-pay

## 2016-07-15 NOTE — Telephone Encounter (Signed)
SENT NOTES TO SCHEDULING 

## 2016-07-17 ENCOUNTER — Other Ambulatory Visit: Payer: Self-pay | Admitting: Obstetrics & Gynecology

## 2016-07-17 DIAGNOSIS — M8580 Other specified disorders of bone density and structure, unspecified site: Secondary | ICD-10-CM

## 2016-07-22 ENCOUNTER — Telehealth: Payer: Self-pay | Admitting: Cardiology

## 2016-07-22 NOTE — Telephone Encounter (Signed)
Received records from [place] for appointment on 08/02/16 with Dr Percival Spanish.  Records given to [facility] (medical records) for Dr Hochrein's schedule on 08/02/16. lp

## 2016-07-31 DIAGNOSIS — E559 Vitamin D deficiency, unspecified: Secondary | ICD-10-CM | POA: Insufficient documentation

## 2016-07-31 DIAGNOSIS — Z833 Family history of diabetes mellitus: Secondary | ICD-10-CM | POA: Insufficient documentation

## 2016-07-31 DIAGNOSIS — I209 Angina pectoris, unspecified: Secondary | ICD-10-CM | POA: Insufficient documentation

## 2016-07-31 DIAGNOSIS — F411 Generalized anxiety disorder: Secondary | ICD-10-CM | POA: Insufficient documentation

## 2016-07-31 DIAGNOSIS — E782 Mixed hyperlipidemia: Secondary | ICD-10-CM | POA: Insufficient documentation

## 2016-07-31 DIAGNOSIS — F430 Acute stress reaction: Secondary | ICD-10-CM | POA: Insufficient documentation

## 2016-07-31 DIAGNOSIS — K219 Gastro-esophageal reflux disease without esophagitis: Secondary | ICD-10-CM | POA: Insufficient documentation

## 2016-07-31 DIAGNOSIS — K146 Glossodynia: Secondary | ICD-10-CM | POA: Insufficient documentation

## 2016-07-31 DIAGNOSIS — Z9229 Personal history of other drug therapy: Secondary | ICD-10-CM | POA: Insufficient documentation

## 2016-07-31 NOTE — Progress Notes (Signed)
Cardiology Office Note   Date:  08/02/2016   ID:  MCKENZEE KUEBER, DOB 27-Mar-1951, MRN [id number]  PCP:  Melinda Crutch, MD  Cardiologist:   Minus Breeding, MD  Referring:  Melinda Crutch, MD  Chief Complaint  Patient presents with  . Chest Pain      History of Present Illness: Alison [name] is a 65 y.o. female who presents for evaluation of chest discomfort. She was seen in 2008 by Dr. Radford Pax. She had chest pain. She had a negative stress test at that time. I was able to review these records. She continued to have some discomfort and was last seen in 2012. At that point she had discomfort and eventually had cardiac catheterization. It was able to review this result and she had normal coronaries. However, this Thanksgiving she was walking up an incline. She felt some discomfort walking up hill with her son. This was a left aching discomfort. There was some tingling that went from her arm down into her wrist. It was a mild 3 out of 10 discomfort perhaps heavy. He stopped when she started to go downhill. She may have this sporadically but is not reproducible with activities. She didn't have any associated symptoms such as nausea vomiting or diaphoresis. She's not had any palpitations, presyncope or syncope. She's had no PND or orthopnea. She said no weight gain or edema. She has had increased emotional stress. She's not been exercising as much as she should. She had exercise more in the past.   Past Medical History:  Diagnosis Date  . Arthritis   . Chest pain   . Fibromuscular dysplasia ([place])   . Hypercholesteremia   . Hypertension    on meds  . Osteoporosis     Past Surgical History:  Procedure Laterality Date  . ABDOMINAL HYSTERECTOMY    . APPENDECTOMY     1969  . CARDIAC CATHETERIZATION     normal cath , chest pains due to stress  . CESAREAN SECTION  1985  . colonscopy  2009  . LUMBAR LAMINECTOMY/DECOMPRESSION MICRODISCECTOMY  12/23/2011   Procedure: LUMBAR  LAMINECTOMY/DECOMPRESSION MICRODISCECTOMY 1 LEVEL;  Surgeon: Hosie Spangle, MD;  Location: [facility];  Service: Neurosurgery;  Laterality: Right;  RIGHT Lumbar two- three laminotomy and microdiskectomy  . ORIF WRIST FRACTURE  2006     Current Outpatient Prescriptions  Medication Sig Dispense Refill  . amLODipine-benazepril (LOTREL) 5-10 MG per capsule Take 1 capsule by mouth daily.    Marland Kitchen aspirin EC 81 MG tablet Take 81 mg by mouth daily.    . benazepril (LOTENSIN) 10 MG tablet Take 10 mg by mouth daily.    Marland Kitchen BIOTIN PO Take 1 tablet by mouth daily.    Marland Kitchen buPROPion (WELLBUTRIN XL) 300 MG 24 hr tablet Take 300 mg by mouth daily.    . Cholecalciferol (VITAMIN D-3 PO) Take 1 capsule by mouth daily.    . Coenzyme Q10 (CO Q-10) 200 MG CAPS Take 1 capsule by mouth daily.    . Cyanocobalamin (VITAMIN B12 PO) Take 1 tablet by mouth daily.    . Multiple Vitamins-Minerals (ONE-A-DAY WOMENS 50 PLUS PO) Take 1 tablet by mouth daily.    Marland Kitchen omeprazole (PRILOSEC) 20 MG capsule Take 20 mg by mouth daily.    . simvastatin (ZOCOR) 20 MG tablet Take 20 mg by mouth daily.     No current facility-administered medications for this visit.     Allergies:   Penicillins; Shellfish allergy; Tetracycline hcl;  and Tramadol    Social History:  The patient  reports that she has never smoked. She has never used smokeless tobacco. She reports that she drinks alcohol. She reports that she does not use drugs.   Family History:  The patient's family history includes Atrial fibrillation in her father; Brain cancer in her mother; Diabetes in her father; Heart disease in her father; Hyperlipidemia in her father and mother; Hypothyroidism in her sister; Lupus in her paternal uncle; Osteoarthritis in her paternal aunt; Peripheral vascular disease in her father.    ROS:  Please see the history of present illness.   Otherwise, review of systems are positive for none.   All other systems are reviewed and negative.     PHYSICAL EXAM: VS:  BP 118/78   Pulse 87   Ht 5\' 1"  (1.549 m)   Wt 139 lb (63 kg)   BMI 26.26 kg/m  , BMI Body mass index is 26.26 kg/m. GENERAL:  Well appearing HEENT:  Pupils equal round and reactive, fundi not visualized, oral mucosa unremarkable NECK:  No jugular venous distention, waveform within normal limits, carotid upstroke brisk and symmetric, no bruits, no thyromegaly LYMPHATICS:  No cervical, inguinal adenopathy LUNGS:  Clear to auscultation bilaterally BACK:  No CVA tenderness CHEST:  Unremarkable HEART:  PMI not displaced or sustained,S1 and S2 within normal limits, no S3, no S4, no clicks, no rubs, no murmurs ABD:  Flat, positive bowel sounds normal in frequency in pitch, no bruits, no rebound, no guarding, no midline pulsatile mass, no hepatomegaly, no splenomegaly EXT:  2 plus pulses throughout, no edema, no cyanosis no clubbing SKIN:  No rashes no nodules NEURO:  Cranial nerves II through XII grossly intact, motor grossly intact throughout PSYCH:  Cognitively intact, oriented to person place and time    EKG:  EKG is ordered today. The ekg ordered today demonstrates sinus rhythm, rate 87, axis within normal limits, intervals within normal limits, poor anterior R wave progression. No acute ST-T wave changes.   Recent Labs: No results found for requested labs within last 8760 hours.    Lipid Panel No results found for: CHOL, TRIG, HDL, CHOLHDL, VLDL, LDLCALC, LDLDIRECT    Wt Readings from Last 3 Encounters:  08/02/16 139 lb (63 kg)  12/20/11 139 lb 15.9 oz (63.5 kg)      Other studies Reviewed: Additional studies/ records that were reviewed today include: Office records. Review of the above records demonstrates:  Please see elsewhere in the note.     ASSESSMENT AND PLAN:  CHEST PAIN: There are some more typical features. However, I think the pretest probability of obstructive coronary disease is low. Therefore, based on ACC/AHA guidelines, the  patient would be at acceptable risk for the planned procedure without further cardiovascular testing.  If this is negative I would not suggest further cardiac work up.  DYSPNE she does have this mildly. I suspect this will improve walking regimen we talked about this at great length. She'll let me know however it does not improve.  HTN:  The blood pressure is at target. No change in medications is indicated. We will continue with therapeutic lifestyle changes (TLC).  DYSLIPIDEMIA:      Current medicines are reviewed at length with the patient today.  The patient does not have concerns regarding medicines.  The following changes have been made:  no change  Labs/ tests ordered today include:   Orders Placed This Encounter  Procedures  . EXERCISE TOLERANCE TEST  Disposition:   FU with me as needed    Signed, Minus Breeding, MD  08/02/2016 1:17 PM    [facility]

## 2016-08-02 ENCOUNTER — Encounter: Payer: Self-pay | Admitting: Cardiology

## 2016-08-02 ENCOUNTER — Ambulatory Visit (INDEPENDENT_AMBULATORY_CARE_PROVIDER_SITE_OTHER): Payer: BLUE CROSS/BLUE SHIELD | Admitting: Cardiology

## 2016-08-02 VITALS — BP 118/78 | HR 87 | Ht 61.0 in | Wt 139.0 lb

## 2016-08-02 DIAGNOSIS — R0789 Other chest pain: Secondary | ICD-10-CM

## 2016-08-02 DIAGNOSIS — R0602 Shortness of breath: Secondary | ICD-10-CM

## 2016-08-02 NOTE — Patient Instructions (Signed)
Medication Instructions:  Continue current medications  Labwork: None Ordered  Testing/Procedures: Your physician has requested that you have an exercise tolerance test. For further information please visit [email or web address]. Please also follow instruction sheet, as given.  Follow-Up: Your physician recommends that you schedule a follow-up appointment in: As Needed   Any Other Special Instructions Will Be Listed Below (If Applicable).        HAPPY HOLIDAY   If you need a refill on your cardiac medications before your next appointment, please call your pharmacy.

## 2016-08-06 ENCOUNTER — Telehealth (HOSPITAL_COMMUNITY): Payer: Self-pay

## 2016-08-06 NOTE — Telephone Encounter (Signed)
Encounter complete. 

## 2016-08-08 ENCOUNTER — Ambulatory Visit (HOSPITAL_COMMUNITY)
Admission: RE | Admit: 2016-08-08 | Discharge: 2016-08-08 | Disposition: A | Discharge status: Home / Self Care | Payer: BLUE CROSS/BLUE SHIELD | Source: Ambulatory Visit | Attending: Cardiology | Admitting: Cardiology

## 2016-08-08 DIAGNOSIS — R0789 Other chest pain: Secondary | ICD-10-CM | POA: Insufficient documentation

## 2016-08-08 LAB — EXERCISE TOLERANCE TEST
CHL CUP MPHR: 156 {beats}/min
CHL CUP RESTING HR STRESS: 82 {beats}/min
CHL RATE OF PERCEIVED EXERTION: 17
CSEPEDS: 0 s
CSEPPHR: 148 {beats}/min
Estimated workload: 10.1 METS
Exercise duration (min): 9 min
Percent HR: 94 %

## 2016-08-09 ENCOUNTER — Encounter (HOSPITAL_COMMUNITY): Payer: BLUE CROSS/BLUE SHIELD

## 2016-08-10 ENCOUNTER — Emergency Department (HOSPITAL_BASED_OUTPATIENT_CLINIC_OR_DEPARTMENT_OTHER)
Admission: EM | Admit: 2016-08-10 | Discharge: 2016-08-10 | Disposition: A | Discharge status: Home / Self Care | Payer: BLUE CROSS/BLUE SHIELD | Attending: Emergency Medicine | Admitting: Emergency Medicine

## 2016-08-10 ENCOUNTER — Emergency Department (HOSPITAL_BASED_OUTPATIENT_CLINIC_OR_DEPARTMENT_OTHER): Payer: BLUE CROSS/BLUE SHIELD

## 2016-08-10 ENCOUNTER — Encounter (HOSPITAL_BASED_OUTPATIENT_CLINIC_OR_DEPARTMENT_OTHER): Payer: Self-pay | Admitting: *Deleted

## 2016-08-10 DIAGNOSIS — I10 Essential (primary) hypertension: Secondary | ICD-10-CM | POA: Diagnosis not present

## 2016-08-10 DIAGNOSIS — R1011 Right upper quadrant pain: Secondary | ICD-10-CM | POA: Insufficient documentation

## 2016-08-10 DIAGNOSIS — Z79899 Other long term (current) drug therapy: Secondary | ICD-10-CM | POA: Diagnosis not present

## 2016-08-10 DIAGNOSIS — R1013 Epigastric pain: Secondary | ICD-10-CM | POA: Insufficient documentation

## 2016-08-10 DIAGNOSIS — Z7982 Long term (current) use of aspirin: Secondary | ICD-10-CM | POA: Insufficient documentation

## 2016-08-10 HISTORY — DX: Gastro-esophageal reflux disease without esophagitis: K21.9

## 2016-08-10 LAB — URINALYSIS, ROUTINE W REFLEX MICROSCOPIC
BILIRUBIN URINE: NEGATIVE
GLUCOSE, UA: NEGATIVE mg/dL
HGB URINE DIPSTICK: NEGATIVE
Ketones, ur: 15 mg/dL — AB
Leukocytes, UA: NEGATIVE
Nitrite: NEGATIVE
Protein, ur: NEGATIVE mg/dL
SPECIFIC GRAVITY, URINE: 1.005 (ref 1.005–1.030)
pH: 7 (ref 5.0–8.0)

## 2016-08-10 LAB — COMPREHENSIVE METABOLIC PANEL
ALT: 34 U/L (ref 14–54)
ANION GAP: 8 (ref 5–15)
AST: 27 U/L (ref 15–41)
Albumin: 3.8 g/dL (ref 3.5–5.0)
Alkaline Phosphatase: 80 U/L (ref 38–126)
BUN: 10 mg/dL (ref 6–20)
CHLORIDE: 104 mmol/L (ref 101–111)
CO2: 28 mmol/L (ref 22–32)
CREATININE: 0.79 mg/dL (ref 0.44–1.00)
Calcium: 9.2 mg/dL (ref 8.9–10.3)
GFR calc non Af Amer: >60 mL/min (ref >60)
Glucose, Bld: 98 mg/dL (ref 65–99)
Potassium: 4 mmol/L (ref 3.5–5.1)
SODIUM: 140 mmol/L (ref 135–145)
Total Bilirubin: 0.4 mg/dL (ref 0.3–1.2)
Total Protein: 7 g/dL (ref 6.5–8.1)

## 2016-08-10 LAB — CBC WITH DIFFERENTIAL/PLATELET
BAND NEUTROPHILS: 0 %
BASOS ABS: 0 10*3/uL (ref 0.0–0.1)
Basophils Relative: 0 %
Blasts: 0 %
EOS PCT: 0 %
Eosinophils Absolute: 0 10*3/uL (ref 0.0–0.7)
HCT: 37.7 % (ref 36.0–46.0)
Hemoglobin: 12.7 g/dL (ref 12.0–15.0)
LYMPHS ABS: 1.7 10*3/uL (ref 0.7–4.0)
Lymphocytes Relative: 16 %
MCH: 29.5 pg (ref 26.0–34.0)
MCHC: 33.7 g/dL (ref 30.0–36.0)
MCV: 87.5 fL (ref 78.0–100.0)
METAMYELOCYTES PCT: 0 %
MONO ABS: 0.2 10*3/uL (ref 0.1–1.0)
MONOS PCT: 2 %
Myelocytes: 0 %
NEUTROS ABS: 8.6 10*3/uL — AB (ref 1.7–7.7)
Neutrophils Relative %: 82 %
PLATELETS: 211 10*3/uL (ref 150–400)
Promyelocytes Absolute: 0 %
RBC: 4.31 MIL/uL (ref 3.87–5.11)
RDW: 12.2 % (ref 11.5–15.5)
WBC: 10.5 10*3/uL (ref 4.0–10.5)
nRBC: 0 /100 WBC

## 2016-08-10 LAB — LIPASE, BLOOD: Lipase: 26 U/L (ref 11–51)

## 2016-08-10 MED ORDER — ONDANSETRON HCL 4 MG/2ML IJ SOLN
4.0000 mg | Freq: Once | INTRAMUSCULAR | Status: AC
  Administered 2016-08-10: 4 mg via INTRAVENOUS
  Filled 2016-08-10: qty 2

## 2016-08-10 MED ORDER — HYDROCODONE-ACETAMINOPHEN 5-325 MG PO TABS: 1.0000 | ORAL_TABLET | ORAL | 0 refills | Status: DC | PRN

## 2016-08-10 MED ORDER — HYDROCODONE-ACETAMINOPHEN 5-325 MG PO TABS
1.0000 | ORAL_TABLET | Freq: Once | ORAL | Status: AC
  Administered 2016-08-10: 1 via ORAL
  Filled 2016-08-10: qty 1

## 2016-08-10 MED ORDER — MORPHINE SULFATE (PF) 4 MG/ML IV SOLN
4.0000 mg | Freq: Once | INTRAVENOUS | Status: AC
  Administered 2016-08-10: 4 mg via INTRAVENOUS
  Filled 2016-08-10: qty 1

## 2016-08-10 MED ORDER — SODIUM CHLORIDE 0.9 % IV BOLUS (SEPSIS)
1000.0000 mL | Freq: Once | INTRAVENOUS | Status: AC
Start: 2016-08-10 — End: 2016-08-10
  Administered 2016-08-10: 1000 mL via INTRAVENOUS

## 2016-08-10 MED ORDER — SUCRALFATE 1 GM/10ML PO SUSP
1.0000 g | Freq: Three times a day (TID) | ORAL | 0 refills | Status: DC
Start: 2016-08-10 — End: 2019-11-02

## 2016-08-10 MED ORDER — GI COCKTAIL ~~LOC~~
30.0000 mL | Freq: Once | ORAL | Status: AC
  Administered 2016-08-10: 30 mL via ORAL
  Filled 2016-08-10: qty 30

## 2016-08-10 NOTE — ED Notes (Signed)
Pt ambulated to restroom with stand by assist only, in NAD. C/o ABD pain 3/10

## 2016-08-10 NOTE — ED Triage Notes (Signed)
Patient states she has a one day history of upper abdominal pain.  Describes pain as constant and sharp.  Denies any recent nausea or vomiting.  States five days ago, she had severe vomiting which lasted for one day and resolved.  States she has continued to fell bad all week, with weakness.

## 2016-08-10 NOTE — Discharge Instructions (Signed)
Please increase your Prilosec to 40 mg a day. Take the Carafate 4 times a day with meals and at bedtime on an empty stomach. I am giving you a short course of pain medicine to use as needed. Please stay hydrated. I also recommended giving over-the-counter Zantac to take once a day. Please follow-up with [facility] for further workup. Follow-up with her primary care doctor in regards to today's visit. Return to the ED if you develops any nausea, vomiting, worsening abdominal pain, fevers, change in urinary or bowel habits including blood in her stool.

## 2016-08-10 NOTE — ED Provider Notes (Signed)
[facility] Provider Note   CSN: [id number] Arrival date & time: 08/10/16  1151     History   Chief Complaint Chief Complaint  Patient presents with  . Abdominal Pain    HPI Alison [name] is a 65 y.o. female.  65 year old Caucasian female past medical history significant for acid reflux, hypertension that presents to the ED today with epigastric abdominal pain. Patient states that her pain started approximately 24 hours ago. She describes the pain as sharp and constant. The pain radiates to her back. Nothing makes better or worse. She has tried Maalox, omeprazole little relief. She denies any emesis. She does endorse mild nausea. Approximately 5 days ago she was diagnosed with Norp virus with severe vomiting and diarrhea. This has resolved since onset. She denies any history of pancreatitis or gallbladder issues. Patient states the pain is not associated with food. She denies any fever, chills, headache, vision changes, lightheadedness, dizziness, cough, chest pain, shortness of breath, urinary symptoms, vaginal symptoms, change in bowel habits, denies hematochezia or melena, numbness/tingling. Patient had an endoscopy 3 years ago that was normal. Patient does have history of acid reflux and takes Prilosec daily.      Past Medical History:  Diagnosis Date  . Arthritis   . Chest pain   . Fibromuscular dysplasia ([place])   . GERD (gastroesophageal reflux disease)   . Hypercholesteremia   . Hypertension    on meds  . Osteoporosis     Patient Active Problem List   Diagnosis Date Noted  . Acute stress disorder 07/31/2016  . Angina pectoris ([place]) 07/31/2016  . Anxiety state 07/31/2016  . 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 FH: diabetes mellitus 07/31/2016  . Gastroesophageal reflux disease 07/31/2016  . History of high risk medication treatment 07/31/2016  . Mixed hyperlipidemia 07/31/2016  . Vitamin D deficiency 07/31/2016  . Hypertension   . Arthritis   .  Osteoporosis   . Chest pain   . Fibromuscular dysplasia ([place])   . Hypercholesteremia     Past Surgical History:  Procedure Laterality Date  . ABDOMINAL HYSTERECTOMY    . APPENDECTOMY     1969  . CARDIAC CATHETERIZATION     normal cath , chest pains due to stress  . CESAREAN SECTION  1985  . colonscopy  2009  . LUMBAR LAMINECTOMY/DECOMPRESSION MICRODISCECTOMY  12/23/2011   Procedure: LUMBAR LAMINECTOMY/DECOMPRESSION MICRODISCECTOMY 1 LEVEL;  Surgeon: Hosie Spangle, MD;  Location: [facility];  Service: Neurosurgery;  Laterality: Right;  RIGHT Lumbar two- three laminotomy and microdiskectomy  . ORIF WRIST FRACTURE  2006    OB History    No data available       Home Medications    Prior to Admission medications   Medication Sig Start Date End Date Taking? Authorizing Provider  amLODipine-benazepril (LOTREL) 5-10 MG per capsule Take 1 capsule by mouth every other day.    Yes Historical Provider, MD  aspirin EC 81 MG tablet Take 81 mg by mouth daily.   Yes Historical Provider, MD  BIOTIN PO Take 1 tablet by mouth daily.   Yes Historical Provider, MD  buPROPion (WELLBUTRIN XL) 300 MG 24 hr tablet Take 300 mg by mouth daily. 05/26/15  Yes Historical Provider, MD  Cholecalciferol (VITAMIN D-3 PO) Take 1 capsule by mouth daily.   Yes Historical Provider, MD  Coenzyme Q10 (CO Q-10) 200 MG CAPS Take 1 capsule by mouth daily.   Yes Historical Provider, MD  Cyanocobalamin (VITAMIN B12 PO) Take  1 tablet by mouth daily.   Yes Historical Provider, MD  Multiple Vitamins-Minerals (ONE-A-DAY WOMENS 50 PLUS PO) Take 1 tablet by mouth daily.   Yes Historical Provider, MD  omeprazole (PRILOSEC) 20 MG capsule Take 20 mg by mouth daily. 04/28/13  Yes Historical Provider, MD  simvastatin (ZOCOR) 20 MG tablet Take 20 mg by mouth daily.   Yes Historical Provider, MD  benazepril (LOTENSIN) 10 MG tablet Take 10 mg by mouth daily. 07/12/16   Historical Provider, MD    Family History Family History    Problem Relation Age of Onset  . Hyperlipidemia Mother   . Brain cancer Mother   . Heart disease Father     Vavle replacement   . Hyperlipidemia Father   . Diabetes Father   . Peripheral vascular disease Father   . Atrial fibrillation Father   . Osteoarthritis Paternal Aunt   . Lupus Paternal Uncle   . Hypothyroidism Sister   . Anesthesia problems Neg Hx   . Hypotension Neg Hx   . Malignant hyperthermia Neg Hx   . Pseudochol deficiency Neg Hx     Social History Social History  Substance Use Topics  . Smoking status: Never Smoker  . Smokeless tobacco: Never Used  . Alcohol use Yes     Comment: social     Allergies   Penicillins; Shellfish allergy; Tetracycline hcl; and Tramadol   Review of Systems Review of Systems  Constitutional: Negative for chills and fever.  HENT: Negative for congestion.   Respiratory: Negative for cough and shortness of breath.   Cardiovascular: Negative for chest pain.  Gastrointestinal: Positive for abdominal pain and nausea. Negative for abdominal distention, blood in stool, constipation, diarrhea and vomiting.  Genitourinary: Negative for dysuria, frequency, hematuria, urgency, vaginal bleeding and vaginal discharge.  Musculoskeletal: Positive for back pain.  Skin: Negative.   Neurological: Negative for dizziness, syncope, weakness, light-headedness and headaches.  All other systems reviewed and are negative.    Physical Exam Updated Vital Signs BP 144/86 (BP Location: Left Arm)   Pulse 80   Temp 98.1 F (36.7 C) (Oral)   Resp 20   Ht 5\' 1"  (1.549 m)   Wt 62.1 kg   SpO2 100%   BMI 25.89 kg/m   Physical Exam  Constitutional: She is oriented to person, place, and time. She appears well-developed and well-nourished. No distress.  HENT:  Head: Normocephalic and atraumatic.  Mouth/Throat: Uvula is midline and oropharynx is clear and moist. Mucous membranes are dry.  Eyes: Conjunctivae are normal. Pupils are equal, round, and  reactive to light. Right eye exhibits no discharge. Left eye exhibits no discharge. No scleral icterus.  Neck: Normal range of motion. Neck supple. No thyromegaly present.  Cardiovascular: Normal rate, regular rhythm, normal heart sounds and intact distal pulses.  Exam reveals no gallop and no friction rub.   No murmur heard. Pulmonary/Chest: Effort normal and breath sounds normal. No respiratory distress.  Abdominal: Soft. Bowel sounds are normal. She exhibits no distension. There is generalized tenderness and tenderness in the right upper quadrant and epigastric area. There is guarding (voluntary). There is no rigidity, no rebound, no CVA tenderness, no tenderness at McBurney's point and negative Murphy's sign.  Musculoskeletal: Normal range of motion.  Lymphadenopathy:    She has no cervical adenopathy.  Neurological: She is alert and oriented to person, place, and time.  Skin: Skin is warm and dry. Capillary refill takes less than 2 seconds.  Nursing note and vitals reviewed.  ED Treatments / Results  Labs (all labs ordered are listed, but only abnormal results are displayed) Labs Reviewed  URINALYSIS, ROUTINE W REFLEX MICROSCOPIC - Abnormal; Notable for the following:       Result Value   Ketones, ur 15 (*)    All other components within normal limits  CBC WITH DIFFERENTIAL/PLATELET - Abnormal; Notable for the following:    Hemoglobin 11.3 (*)    MCHC 29.7 (*)    RDW 21.0 (*)    Neutro Abs 8.4 (*)    All other components within normal limits  CBC WITH DIFFERENTIAL/PLATELET - Abnormal; Notable for the following:    Neutro Abs 8.6 (*)    All other components within normal limits  COMPREHENSIVE METABOLIC PANEL  LIPASE, BLOOD    EKG  EKG Interpretation None       Radiology US Abdomen Complete  Result Date: 08/10/2016 CLINICAL DATA:  Constant epigastric pain for 1 day. EXAM: ABDOMEN ULTRASOUND COMPLETE COMPARISON:  CT abdomen and pelvis on 10/10/2006 FINDINGS:  Gallbladder: Gallbladder has a normal appearance. Gallbladder wall is 2.1 mm, within normal limits. No stones or pericholecystic fluid. No sonographic Murphy's sign. Common bile duct: Diameter: 1.5 mm Liver: Liver is mildly echogenic without other features of hepatic steatosis. A right hepatic cyst is 3.2 x 3.2 x 3.0 cm. Posteriorly in the liver, there is a vague hypoechoic area adjacent to the inferior vena cava raising the question of focal fatty infiltration or other lesion. This area measures 2.3 x 2.1 x 3.1 cm. IVC: No abnormality visualized. Pancreas: Pancreas is not well seen because of bowel gas. Spleen: Size and appearance within normal limits. Right Kidney: Length: 10.8 cm. Within the midpole region there is a 7 mm cyst with single internal septation. No hydronephrosis. Left Kidney: Length: 11.0 cm. Echogenicity within normal limits. No mass or hydronephrosis visualized. Abdominal aorta: Portions are not imaged.  No aneurysm identified. Other findings: None. IMPRESSION: 1. Echogenic liver without definitive criteria for hepatic steatosis. 2. Right hepatic cyst. 3. Possible second liver lesion in the posterior right hepatic lobe adjacent to the inferior vena cava. Further evaluation with MRI is recommended. MRI should be performed when the patient is clinically stable and able to follow breath holding instructions (usually best performed on an outpatient basis). 4. No hydronephrosis or acute renal abnormality. Electronically Signed   By: Nolon Nations M.D.   On: 08/10/2016 13:52    Procedures Procedures (including critical care time)  Medications Ordered in ED Medications  sodium chloride 0.9 % bolus 1,000 mL (0 mLs Intravenous Stopped 08/10/16 1403)  morphine 4 MG/ML injection 4 mg (4 mg Intravenous Given 08/10/16 1303)  ondansetron (ZOFRAN) injection 4 mg (4 mg Intravenous Given 08/10/16 1302)  gi cocktail (Maalox,Lidocaine,Donnatal) (30 mLs Oral Given 08/10/16 1556)  ondansetron (ZOFRAN)  injection 4 mg (4 mg Intravenous Given 08/10/16 1553)  HYDROcodone-acetaminophen (NORCO/VICODIN) 5-325 MG per tablet 1 tablet (1 tablet Oral Given 08/10/16 1555)     Initial Impression / Assessment and Plan / ED Course  I have reviewed the triage vital signs and the nursing notes.  Pertinent labs & imaging results that were available during my care of the patient were reviewed by me and considered in my medical decision making (see chart for details).  Clinical Course   Patient presents to the ED today with epigastric pain. She was seen in urgent care and sent here for rule out pancreatitis. Patient with known history of acid reflux. Patient recently had normal virus which  is self resolved. Patient is mild tender to the epigastric region. No leukocytosis noted. Lipase is normal. All labs are unremarkable. Hemoglobin was stable 11.3. Ultrasound of the abdomen revealed no acute abnormality. No signs of cholecystitis. Patient does have a liver lesion that was poorly visualized with ultrasound. Recommend outpatient MRI with patient is clinically stable. Doubt pancreatitis given normal lipase and no concerning signs on ultrasound. Doubt cholecystitis given ultrasound findings. Differential diagnosis includes PUD, duodenal ulcer, worsening acid reflux given recent gastroenteritis. Patient's vital signs are stable. Patient afebrile, not tachycardic, normotensive. The pain was treated in the ED. Patient denies any nausea. Patient was given GI cocktail with relief in her symptoms. Urine without signs of infection. Encourage patient to increase her omeprazole to 40 mg per day. Given prescription for Carafate to take before meals. Small dose of pain medicine given. Patient given referral to gastroenterologist for further workup. Patient informed of her ultrasound findings. Patient feels comfortable and stable with discharge. Pt is hemodynamically stable, in NAD, & able to ambulate in the ED. Pain has been managed &  has no complaints prior to dc. above plan and is stable for discharge at this time. All questions were answered prior to disposition. Strict return precautions for f/u to the ED were discussed.Pt is comfortable with the patient discussed with Dr. Jeneen Rinks who agrees with the above plan.   Final Clinical Impressions(s) / ED Diagnoses   Final diagnoses:  Epigastric pain    New Prescriptions Discharge Medication List as of 08/10/2016  4:08 PM    START taking these medications   Details  HYDROcodone-acetaminophen (NORCO/VICODIN) 5-325 MG tablet Take 1-2 tablets by mouth every 4 (four) hours as needed., Starting Sat 08/10/2016, Print    sucralfate (CARAFATE) 1 GM/10ML suspension Take 10 mLs (1 g total) by mouth 4 (four) times daily -  with meals and at bedtime., Starting Sat 08/10/2016, Print         Doristine Devoid, PA-C 08/11/16 1654    Tanna Furry, MD 08/24/16 [phone]

## 2016-08-10 NOTE — ED Notes (Signed)
Patient transported to Ultrasound 

## 2016-08-13 LAB — CBC WITH DIFFERENTIAL/PLATELET
Basophils Absolute: 0 10*3/uL (ref 0.0–0.1)
Basophils Relative: 0 %
Eosinophils Absolute: 0 10*3/uL (ref 0.0–0.7)
Eosinophils Relative: 0 %
HCT: 38 % (ref 36.0–46.0)
HEMOGLOBIN: 11.3 g/dL — AB (ref 12.0–15.0)
LYMPHS ABS: 0.9 10*3/uL (ref 0.7–4.0)
LYMPHS PCT: 9 %
MCH: 26.2 pg (ref 26.0–34.0)
MCHC: 29.7 g/dL — ABNORMAL LOW (ref 30.0–36.0)
MCV: 88.2 fL (ref 78.0–100.0)
Monocytes Absolute: 0.7 10*3/uL (ref 0.1–1.0)
Monocytes Relative: 7 %
NEUTROS ABS: 8.4 10*3/uL — AB (ref 1.7–7.7)
NEUTROS PCT: 84 %
Platelets: 236 10*3/uL (ref 150–400)
RBC: 4.31 MIL/uL (ref 3.87–5.11)
RDW: 21 % — ABNORMAL HIGH (ref 11.5–15.5)
WBC: 10 10*3/uL (ref 4.0–10.5)

## 2016-08-15 ENCOUNTER — Other Ambulatory Visit: Payer: Self-pay | Admitting: Gastroenterology

## 2016-08-15 DIAGNOSIS — K769 Liver disease, unspecified: Secondary | ICD-10-CM

## 2016-08-15 DIAGNOSIS — R1013 Epigastric pain: Secondary | ICD-10-CM

## 2016-08-19 ENCOUNTER — Telehealth: Payer: Self-pay | Admitting: Cardiology

## 2016-08-19 NOTE — Telephone Encounter (Signed)
New Message      Returning [place] call for test results

## 2016-08-19 NOTE — Telephone Encounter (Signed)
Forward to nya

## 2016-08-19 NOTE — Telephone Encounter (Signed)
Pt aware of her stress test 

## 2016-08-22 ENCOUNTER — Ambulatory Visit
Admission: RE | Admit: 2016-08-22 | Discharge: 2016-08-22 | Disposition: A | Discharge status: Home / Self Care | Payer: BLUE CROSS/BLUE SHIELD | Source: Ambulatory Visit | Attending: Gastroenterology | Admitting: Gastroenterology

## 2016-08-22 DIAGNOSIS — R1013 Epigastric pain: Secondary | ICD-10-CM

## 2016-08-22 DIAGNOSIS — K769 Liver disease, unspecified: Secondary | ICD-10-CM

## 2016-08-22 MED ORDER — GADOBENATE DIMEGLUMINE 529 MG/ML IV SOLN
12.0000 mL | Freq: Once | INTRAVENOUS | Status: AC | PRN
  Administered 2016-08-22: 12 mL via INTRAVENOUS

## 2017-06-30 DIAGNOSIS — Z23 Encounter for immunization: Secondary | ICD-10-CM | POA: Diagnosis not present

## 2017-08-27 ENCOUNTER — Other Ambulatory Visit: Payer: Self-pay | Admitting: Obstetrics & Gynecology

## 2017-08-27 DIAGNOSIS — Z1231 Encounter for screening mammogram for malignant neoplasm of breast: Secondary | ICD-10-CM

## 2017-09-01 ENCOUNTER — Ambulatory Visit
Admission: RE | Admit: 2017-09-01 | Discharge: 2017-09-01 | Disposition: A | Discharge status: Home / Self Care | Payer: PPO | Source: Ambulatory Visit | Attending: Obstetrics & Gynecology | Admitting: Obstetrics & Gynecology

## 2017-09-01 DIAGNOSIS — Z1231 Encounter for screening mammogram for malignant neoplasm of breast: Secondary | ICD-10-CM | POA: Diagnosis not present

## 2017-09-19 ENCOUNTER — Other Ambulatory Visit: Payer: Self-pay | Admitting: Obstetrics & Gynecology

## 2017-09-19 DIAGNOSIS — Z124 Encounter for screening for malignant neoplasm of cervix: Secondary | ICD-10-CM | POA: Diagnosis not present

## 2017-09-19 DIAGNOSIS — M8580 Other specified disorders of bone density and structure, unspecified site: Secondary | ICD-10-CM

## 2017-10-03 ENCOUNTER — Ambulatory Visit
Admission: RE | Admit: 2017-10-03 | Discharge: 2017-10-03 | Disposition: A | Discharge status: Home / Self Care | Payer: PPO | Source: Ambulatory Visit | Attending: Obstetrics & Gynecology | Admitting: Obstetrics & Gynecology

## 2017-10-03 DIAGNOSIS — M810 Age-related osteoporosis without current pathological fracture: Secondary | ICD-10-CM | POA: Diagnosis not present

## 2017-10-03 DIAGNOSIS — M8580 Other specified disorders of bone density and structure, unspecified site: Secondary | ICD-10-CM

## 2017-10-03 DIAGNOSIS — Z780 Asymptomatic menopausal state: Secondary | ICD-10-CM | POA: Diagnosis not present

## 2017-11-05 DIAGNOSIS — M810 Age-related osteoporosis without current pathological fracture: Secondary | ICD-10-CM | POA: Diagnosis not present

## 2017-11-06 DIAGNOSIS — M25561 Pain in right knee: Secondary | ICD-10-CM | POA: Diagnosis not present

## 2017-11-11 DIAGNOSIS — M810 Age-related osteoporosis without current pathological fracture: Secondary | ICD-10-CM | POA: Diagnosis not present

## 2017-11-12 DIAGNOSIS — M545 Low back pain: Secondary | ICD-10-CM | POA: Diagnosis not present

## 2017-11-12 DIAGNOSIS — M7061 Trochanteric bursitis, right hip: Secondary | ICD-10-CM | POA: Diagnosis not present

## 2017-11-12 DIAGNOSIS — M5416 Radiculopathy, lumbar region: Secondary | ICD-10-CM | POA: Diagnosis not present

## 2017-11-13 DIAGNOSIS — M545 Low back pain: Secondary | ICD-10-CM | POA: Diagnosis not present

## 2017-11-13 DIAGNOSIS — M7061 Trochanteric bursitis, right hip: Secondary | ICD-10-CM | POA: Diagnosis not present

## 2017-11-13 DIAGNOSIS — M5416 Radiculopathy, lumbar region: Secondary | ICD-10-CM | POA: Diagnosis not present

## 2017-11-18 DIAGNOSIS — M5416 Radiculopathy, lumbar region: Secondary | ICD-10-CM | POA: Diagnosis not present

## 2017-11-18 DIAGNOSIS — M545 Low back pain: Secondary | ICD-10-CM | POA: Diagnosis not present

## 2017-11-18 DIAGNOSIS — M7061 Trochanteric bursitis, right hip: Secondary | ICD-10-CM | POA: Diagnosis not present

## 2017-11-25 DIAGNOSIS — M7061 Trochanteric bursitis, right hip: Secondary | ICD-10-CM | POA: Diagnosis not present

## 2017-11-25 DIAGNOSIS — M545 Low back pain: Secondary | ICD-10-CM | POA: Diagnosis not present

## 2017-11-25 DIAGNOSIS — M5416 Radiculopathy, lumbar region: Secondary | ICD-10-CM | POA: Diagnosis not present

## 2017-11-27 DIAGNOSIS — M5416 Radiculopathy, lumbar region: Secondary | ICD-10-CM | POA: Diagnosis not present

## 2017-11-27 DIAGNOSIS — H02421 Myogenic ptosis of right eyelid: Secondary | ICD-10-CM | POA: Diagnosis not present

## 2017-11-27 DIAGNOSIS — M545 Low back pain: Secondary | ICD-10-CM | POA: Diagnosis not present

## 2017-11-27 DIAGNOSIS — M7061 Trochanteric bursitis, right hip: Secondary | ICD-10-CM | POA: Diagnosis not present

## 2017-11-27 DIAGNOSIS — H02413 Mechanical ptosis of bilateral eyelids: Secondary | ICD-10-CM | POA: Diagnosis not present

## 2017-11-27 DIAGNOSIS — Z01818 Encounter for other preprocedural examination: Secondary | ICD-10-CM | POA: Diagnosis not present

## 2017-12-01 DIAGNOSIS — H02421 Myogenic ptosis of right eyelid: Secondary | ICD-10-CM | POA: Diagnosis not present

## 2017-12-01 DIAGNOSIS — H02413 Mechanical ptosis of bilateral eyelids: Secondary | ICD-10-CM | POA: Diagnosis not present

## 2017-12-15 ENCOUNTER — Encounter (HOSPITAL_COMMUNITY): Payer: Self-pay

## 2017-12-15 ENCOUNTER — Emergency Department (HOSPITAL_COMMUNITY)
Admission: EM | Admit: 2017-12-15 | Discharge: 2017-12-15 | Disposition: A | Discharge status: Home / Self Care | Payer: PPO | Attending: Emergency Medicine | Admitting: Emergency Medicine

## 2017-12-15 ENCOUNTER — Other Ambulatory Visit: Payer: Self-pay

## 2017-12-15 DIAGNOSIS — I10 Essential (primary) hypertension: Secondary | ICD-10-CM | POA: Diagnosis not present

## 2017-12-15 DIAGNOSIS — K644 Residual hemorrhoidal skin tags: Secondary | ICD-10-CM | POA: Diagnosis not present

## 2017-12-15 DIAGNOSIS — Z7982 Long term (current) use of aspirin: Secondary | ICD-10-CM | POA: Diagnosis not present

## 2017-12-15 DIAGNOSIS — Z79899 Other long term (current) drug therapy: Secondary | ICD-10-CM | POA: Diagnosis not present

## 2017-12-15 DIAGNOSIS — K6289 Other specified diseases of anus and rectum: Secondary | ICD-10-CM | POA: Diagnosis present

## 2017-12-15 DIAGNOSIS — K649 Unspecified hemorrhoids: Secondary | ICD-10-CM | POA: Diagnosis not present

## 2017-12-15 MED ORDER — HYDROCORTISONE 2.5 % RE CREA: TOPICAL_CREAM | RECTAL | 0 refills | Status: DC

## 2017-12-15 MED ORDER — HYDROCODONE-ACETAMINOPHEN 5-325 MG PO TABS: 1.0000 | ORAL_TABLET | Freq: Four times a day (QID) | ORAL | 0 refills | Status: DC | PRN

## 2017-12-15 MED ORDER — HYDROCODONE-ACETAMINOPHEN 5-325 MG PO TABS: 2.0000 | ORAL_TABLET | ORAL | 0 refills | Status: DC | PRN

## 2017-12-15 NOTE — ED Notes (Signed)
Chaperone present during PA exam

## 2017-12-15 NOTE — ED Triage Notes (Signed)
Patient complains of rectal pressure x 4 days, no bleeding. States she can feel something at rectum. No problem nor pain with bowel movements

## 2017-12-15 NOTE — ED Provider Notes (Signed)
[facility] EMERGENCY DEPARTMENT Provider Note   CSN: [id number] Arrival date & time: 12/15/17  2542     History   Chief Complaint Chief Complaint  Patient presents with  . Rectal Pain    HPI Alison [name] is a 67 y.o. female.  HPI   67 year old female presents today with complaints of rectal pain.  Patient notes a 5-day history of a nodule in her rectum, painful, feels pressure, denies any bleeding, notes she is been constipated.  Patient notes that her dietary habits have not been good recently with palpable like stools.  She denies any abdominal pain or fever.  No history of the same.  Past Medical History:  Diagnosis Date  . Arthritis   . Chest pain   . Fibromuscular dysplasia ([place])   . GERD (gastroesophageal reflux disease)   . Hypercholesteremia   . Hypertension    on meds  . Osteoporosis     Patient Active Problem List   Diagnosis Date Noted  . Acute stress disorder 07/31/2016  . Angina pectoris ([place]) 07/31/2016  . Anxiety state 07/31/2016  . 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 FH: diabetes mellitus 07/31/2016  . Gastroesophageal reflux disease 07/31/2016  . History of high risk medication treatment 07/31/2016  . Mixed hyperlipidemia 07/31/2016  . Vitamin D deficiency 07/31/2016  . Hypertension   . Arthritis   . Osteoporosis   . Chest pain   . Fibromuscular dysplasia ([place])   . Hypercholesteremia     Past Surgical History:  Procedure Laterality Date  . ABDOMINAL HYSTERECTOMY    . APPENDECTOMY     1969  . CARDIAC CATHETERIZATION     normal cath , chest pains due to stress  . CESAREAN SECTION  1985  . colonscopy  2009  . LUMBAR LAMINECTOMY/DECOMPRESSION MICRODISCECTOMY  12/23/2011   Procedure: LUMBAR LAMINECTOMY/DECOMPRESSION MICRODISCECTOMY 1 LEVEL;  Surgeon: Hosie Spangle, MD;  Location: [facility];  Service: Neurosurgery;  Laterality: Right;  RIGHT Lumbar two- three laminotomy and microdiskectomy  . ORIF WRIST FRACTURE   2006     OB History   None      Home Medications    Prior to Admission medications   Medication Sig Start Date End Date Taking? Authorizing Provider  amLODipine-benazepril (LOTREL) 5-10 MG per capsule Take 1 capsule by mouth every other day.    Yes [provider]  aspirin EC 81 MG tablet Take 81 mg by mouth daily.   Yes [provider]  BIOTIN PO Take 1 tablet by mouth daily.   Yes [provider]  buPROPion (WELLBUTRIN XL) 300 MG 24 hr tablet Take 300 mg by mouth daily. 05/26/15  Yes [provider]  cetirizine (ZYRTEC) 10 MG tablet Take 10 mg by mouth as needed for allergies.   Yes [provider]  Cholecalciferol (VITAMIN D-3 PO) Take 1 capsule by mouth daily.   Yes [provider]  Coenzyme Q10 (CO Q-10) 200 MG CAPS Take 1 capsule by mouth daily.   Yes [provider]  Cyanocobalamin (VITAMIN B12 PO) Take 1 tablet by mouth daily.   Yes [provider]  fluticasone (FLONASE) 50 MCG/ACT nasal spray Place 2 sprays into both nostrils as needed for allergies or rhinitis.   Yes [provider]  hydrocortisone cream 1 % Apply 1 application topically as needed for itching.   Yes [provider]  Ibuprofen-diphenhydrAMINE Cit (ADVIL PM PO) Take 1 tablet by mouth as needed (for sleep).  Yes [provider]  Multiple Vitamins-Minerals (ONE-A-DAY WOMENS 50 PLUS PO) Take 1 tablet by mouth daily.   Yes [provider]  omeprazole (PRILOSEC) 20 MG capsule Take 20 mg by mouth daily. 04/28/13  Yes [provider]  PROLIA 60 MG/ML SOSY injection Inject 60 mg into the muscle every 6 (six) months. 11/06/17  Yes [provider]  simvastatin (ZOCOR) 20 MG tablet Take 20 mg by mouth daily.   Yes [provider]  HYDROcodone-acetaminophen (NORCO/VICODIN) 5-325 MG tablet Take 1 tablet by mouth every 6 (six) hours as needed. 12/15/17   Kennesha Brewbaker, Dellis Filbert, PA-C    HYDROcodone-acetaminophen (NORCO/VICODIN) 5-325 MG tablet Take 1 tablet by mouth every 6 (six) hours as needed. 12/15/17   Jeimy Bickert, Dellis Filbert, PA-C  hydrocortisone (ANUSOL-HC) 2.5 % rectal cream Apply rectally 2 times daily 12/15/17   Jaselle Pryer, Dellis Filbert, PA-C  hydrocortisone (ANUSOL-HC) 2.5 % rectal cream Apply rectally 2 times daily 12/15/17   Karthikeya Funke, Dellis Filbert, PA-C  sucralfate (CARAFATE) 1 GM/10ML suspension Take 10 mLs (1 g total) by mouth 4 (four) times daily -  with meals and at bedtime. Patient not taking: Reported on 12/15/2017 08/10/16   Doristine Devoid, PA-C    Family History Family History  Problem Relation Age of Onset  . Hyperlipidemia Mother   . Brain cancer Mother   . Heart disease Father        Vavle replacement   . Hyperlipidemia Father   . Diabetes Father   . Peripheral vascular disease Father   . Atrial fibrillation Father   . Osteoarthritis Paternal Aunt   . Lupus Paternal Uncle   . Hypothyroidism Sister   . Anesthesia problems Neg Hx   . Hypotension Neg Hx   . Malignant hyperthermia Neg Hx   . Pseudochol deficiency Neg Hx     Social History Social History   Tobacco Use  . Smoking status: Never Smoker  . Smokeless tobacco: Never Used  Substance Use Topics  . Alcohol use: Yes    Comment: social  . Drug use: No     Allergies   Penicillins; Shellfish allergy; Tetracycline hcl; and Tramadol   Review of Systems Review of Systems  All other systems reviewed and are negative.  Physical Exam Updated Vital Signs BP 136/74 (BP Location: Right Arm)   Pulse 84   Temp 98.5 F (36.9 C) (Oral)   Resp 20   SpO2 100%   Physical Exam  Constitutional: She is oriented to person, place, and time. She appears well-developed and well-nourished.  HENT:  Head: Normocephalic and atraumatic.  Eyes: Pupils are equal, round, and reactive to light. Conjunctivae are normal. Right eye exhibits no discharge. Left eye exhibits no discharge. No scleral icterus.  Neck: Normal  range of motion. No JVD present. No tracheal deviation present.  Pulmonary/Chest: Effort normal. No stridor.  Genitourinary:  Genitourinary Comments: External hemorrhoid noted in the 4 o'clock position, no bleeding, soft  Neurological: She is alert and oriented to person, place, and time. Coordination normal.  Psychiatric: She has a normal mood and affect. Her behavior is normal. Judgment and thought content normal.  Nursing note and vitals reviewed.    ED Treatments / Results  Labs (all labs ordered are listed, but only abnormal results are displayed) Labs Reviewed - No data to display  EKG None  Radiology No results found.  Procedures Procedures (including critical care time)  Medications Ordered in ED Medications - No data to display   Initial Impression / Assessment and  Plan / ED Course  I have reviewed the triage vital signs and the nursing notes.  Pertinent labs & imaging results that were available during my care of the patient were reviewed by me and considered in my medical decision making (see chart for details).      Final Clinical Impressions(s) / ED Diagnoses   Final diagnoses:  Hemorrhoids, unspecified hemorrhoid type    Labs:   Imaging:  Consults:  Therapeutics:  Discharge Meds:   Assessment/Plan: 67 year old female presents today with hemorrhoids.  Patient will benefit from Anusol, pain medicine, outpatient follow-up and return with any new or worsening signs or symptoms.  Patient verbalized understanding and agreement to today's plan had no further questions or concerns at the time of discharge.   ED Discharge Orders        Ordered    hydrocortisone (ANUSOL-HC) 2.5 % rectal cream     12/15/17 1336    HYDROcodone-acetaminophen (NORCO/VICODIN) 5-325 MG tablet  Every 4 hours PRN,   Status:  Discontinued     12/15/17 1336    HYDROcodone-acetaminophen (NORCO/VICODIN) 5-325 MG tablet  Every 6 hours PRN     12/15/17 1336     HYDROcodone-acetaminophen (NORCO/VICODIN) 5-325 MG tablet  Every 6 hours PRN     12/15/17 1340    hydrocortisone (ANUSOL-HC) 2.5 % rectal cream     12/15/17 1340       Okey Regal, PA-C 12/15/17 1931    Varney Biles, MD 12/17/17 1847

## 2017-12-15 NOTE — Discharge Instructions (Signed)
Please read attached information. If you experience any new or worsening signs or symptoms please return to the emergency room for evaluation. Please follow-up with your primary care provider or specialist as discussed. Please use medication prescribed only as directed and discontinue taking if you have any concerning signs or symptoms.   °

## 2017-12-16 ENCOUNTER — Other Ambulatory Visit: Payer: Self-pay

## 2017-12-16 NOTE — Patient Outreach (Signed)
[facility]) Care Management  12/16/2017  Alison [name] 1950/10/19 [id number]   Referral Date: 12/15/17 Referral Source: Nurse line Referral Reason: Hemorrhoids protruding causing pain.    Outreach Attempt #1 Spoke with patient.  She is able to verify HIPAA.  Patient reports she was driving presently. Advised patient that CM could not speak with her while driving.  Patient states she is at a stop and can talk for a few minutes.  Patient asked if CM was calling about her nurse call on yesterday.  Advised that was the reason for the call. Patient reports she went to the ER and was diagnosed with hemorrhoids.  She was given cream and sent her on her way.  Patient reports that she is doing fine.  Advised patient on increasing fiber in her diet and possibly a stool softener. She verbalized understanding and states she was also given metamucil for constipation.  Patient voices no questions of concerns.     Plan: RN CM will send education on high fiber diet and close case.     Jone Baseman, RN, MSN [facility] Care Management Care Management Coordinator Direct Line [phone] Toll Free: [phone]  Fax: [phone]

## 2017-12-17 DIAGNOSIS — H02413 Mechanical ptosis of bilateral eyelids: Secondary | ICD-10-CM | POA: Diagnosis not present

## 2017-12-17 DIAGNOSIS — H02421 Myogenic ptosis of right eyelid: Secondary | ICD-10-CM | POA: Diagnosis not present

## 2017-12-17 DIAGNOSIS — H02403 Unspecified ptosis of bilateral eyelids: Secondary | ICD-10-CM | POA: Diagnosis not present

## 2018-01-12 DIAGNOSIS — Z23 Encounter for immunization: Secondary | ICD-10-CM | POA: Diagnosis not present

## 2018-01-12 DIAGNOSIS — F430 Acute stress reaction: Secondary | ICD-10-CM | POA: Diagnosis not present

## 2018-01-12 DIAGNOSIS — E782 Mixed hyperlipidemia: Secondary | ICD-10-CM | POA: Diagnosis not present

## 2018-01-12 DIAGNOSIS — I10 Essential (primary) hypertension: Secondary | ICD-10-CM | POA: Diagnosis not present

## 2018-01-12 DIAGNOSIS — E559 Vitamin D deficiency, unspecified: Secondary | ICD-10-CM | POA: Diagnosis not present

## 2018-01-12 DIAGNOSIS — Z0000 Encounter for general adult medical examination without abnormal findings: Secondary | ICD-10-CM | POA: Diagnosis not present

## 2018-01-12 DIAGNOSIS — K219 Gastro-esophageal reflux disease without esophagitis: Secondary | ICD-10-CM | POA: Diagnosis not present

## 2018-01-12 DIAGNOSIS — Z1159 Encounter for screening for other viral diseases: Secondary | ICD-10-CM | POA: Diagnosis not present

## 2018-01-12 DIAGNOSIS — Z79899 Other long term (current) drug therapy: Secondary | ICD-10-CM | POA: Diagnosis not present

## 2018-01-12 DIAGNOSIS — K649 Unspecified hemorrhoids: Secondary | ICD-10-CM | POA: Diagnosis not present

## 2018-02-16 DIAGNOSIS — Z1211 Encounter for screening for malignant neoplasm of colon: Secondary | ICD-10-CM | POA: Diagnosis not present

## 2018-02-16 DIAGNOSIS — K219 Gastro-esophageal reflux disease without esophagitis: Secondary | ICD-10-CM | POA: Diagnosis not present

## 2018-02-16 DIAGNOSIS — R1314 Dysphagia, pharyngoesophageal phase: Secondary | ICD-10-CM | POA: Diagnosis not present

## 2018-04-01 DIAGNOSIS — D122 Benign neoplasm of ascending colon: Secondary | ICD-10-CM | POA: Diagnosis not present

## 2018-04-01 DIAGNOSIS — R1310 Dysphagia, unspecified: Secondary | ICD-10-CM | POA: Diagnosis not present

## 2018-04-01 DIAGNOSIS — Z1211 Encounter for screening for malignant neoplasm of colon: Secondary | ICD-10-CM | POA: Diagnosis not present

## 2018-04-01 DIAGNOSIS — K222 Esophageal obstruction: Secondary | ICD-10-CM | POA: Diagnosis not present

## 2018-04-01 DIAGNOSIS — K449 Diaphragmatic hernia without obstruction or gangrene: Secondary | ICD-10-CM | POA: Diagnosis not present

## 2018-04-01 DIAGNOSIS — K3189 Other diseases of stomach and duodenum: Secondary | ICD-10-CM | POA: Diagnosis not present

## 2018-04-01 DIAGNOSIS — K228 Other specified diseases of esophagus: Secondary | ICD-10-CM | POA: Diagnosis not present

## 2018-04-01 DIAGNOSIS — K5730 Diverticulosis of large intestine without perforation or abscess without bleeding: Secondary | ICD-10-CM | POA: Diagnosis not present

## 2018-04-01 DIAGNOSIS — K2930 Chronic superficial gastritis without bleeding: Secondary | ICD-10-CM | POA: Diagnosis not present

## 2018-04-01 DIAGNOSIS — Q399 Congenital malformation of esophagus, unspecified: Secondary | ICD-10-CM | POA: Diagnosis not present

## 2018-04-06 DIAGNOSIS — K2930 Chronic superficial gastritis without bleeding: Secondary | ICD-10-CM | POA: Diagnosis not present

## 2018-04-06 DIAGNOSIS — K228 Other specified diseases of esophagus: Secondary | ICD-10-CM | POA: Diagnosis not present

## 2018-06-04 DIAGNOSIS — Z23 Encounter for immunization: Secondary | ICD-10-CM | POA: Diagnosis not present

## 2018-06-29 DIAGNOSIS — H2513 Age-related nuclear cataract, bilateral: Secondary | ICD-10-CM | POA: Diagnosis not present

## 2018-07-02 DIAGNOSIS — M810 Age-related osteoporosis without current pathological fracture: Secondary | ICD-10-CM | POA: Diagnosis not present

## 2018-07-27 DIAGNOSIS — H16223 Keratoconjunctivitis sicca, not specified as Sjogren's, bilateral: Secondary | ICD-10-CM | POA: Diagnosis not present

## 2018-07-27 DIAGNOSIS — H2513 Age-related nuclear cataract, bilateral: Secondary | ICD-10-CM | POA: Diagnosis not present

## 2018-09-16 DIAGNOSIS — E559 Vitamin D deficiency, unspecified: Secondary | ICD-10-CM | POA: Diagnosis not present

## 2018-09-16 DIAGNOSIS — Z8349 Family history of other endocrine, nutritional and metabolic diseases: Secondary | ICD-10-CM | POA: Diagnosis not present

## 2018-09-16 DIAGNOSIS — R1310 Dysphagia, unspecified: Secondary | ICD-10-CM | POA: Diagnosis not present

## 2018-09-16 DIAGNOSIS — M542 Cervicalgia: Secondary | ICD-10-CM | POA: Diagnosis not present

## 2018-09-16 DIAGNOSIS — I10 Essential (primary) hypertension: Secondary | ICD-10-CM | POA: Diagnosis not present

## 2018-09-17 ENCOUNTER — Other Ambulatory Visit: Payer: Self-pay | Admitting: Family Medicine

## 2018-09-17 DIAGNOSIS — M542 Cervicalgia: Secondary | ICD-10-CM

## 2018-09-17 DIAGNOSIS — R1310 Dysphagia, unspecified: Secondary | ICD-10-CM

## 2018-09-21 ENCOUNTER — Ambulatory Visit
Admission: RE | Admit: 2018-09-21 | Discharge: 2018-09-21 | Disposition: A | Discharge status: Home / Self Care | Payer: PPO | Source: Ambulatory Visit | Attending: Family Medicine | Admitting: Family Medicine

## 2018-09-21 DIAGNOSIS — E041 Nontoxic single thyroid nodule: Secondary | ICD-10-CM | POA: Diagnosis not present

## 2018-09-21 DIAGNOSIS — R1310 Dysphagia, unspecified: Secondary | ICD-10-CM

## 2018-09-21 DIAGNOSIS — M542 Cervicalgia: Secondary | ICD-10-CM

## 2018-09-22 DIAGNOSIS — Z124 Encounter for screening for malignant neoplasm of cervix: Secondary | ICD-10-CM | POA: Diagnosis not present

## 2018-09-22 DIAGNOSIS — M810 Age-related osteoporosis without current pathological fracture: Secondary | ICD-10-CM | POA: Diagnosis not present

## 2018-09-22 DIAGNOSIS — N952 Postmenopausal atrophic vaginitis: Secondary | ICD-10-CM | POA: Diagnosis not present

## 2018-09-22 DIAGNOSIS — Z1231 Encounter for screening mammogram for malignant neoplasm of breast: Secondary | ICD-10-CM | POA: Diagnosis not present

## 2018-09-25 ENCOUNTER — Other Ambulatory Visit: Payer: Self-pay | Admitting: Family Medicine

## 2018-09-25 DIAGNOSIS — E041 Nontoxic single thyroid nodule: Secondary | ICD-10-CM

## 2018-09-30 ENCOUNTER — Other Ambulatory Visit (HOSPITAL_COMMUNITY)
Admission: RE | Admit: 2018-09-30 | Discharge: 2018-09-30 | Disposition: A | Discharge status: Home / Self Care | Payer: PPO | Source: Ambulatory Visit | Attending: Radiology | Admitting: Radiology

## 2018-09-30 ENCOUNTER — Ambulatory Visit
Admission: RE | Admit: 2018-09-30 | Discharge: 2018-09-30 | Disposition: A | Discharge status: Home / Self Care | Payer: PPO | Source: Ambulatory Visit | Attending: Family Medicine | Admitting: Family Medicine

## 2018-09-30 DIAGNOSIS — E041 Nontoxic single thyroid nodule: Secondary | ICD-10-CM | POA: Diagnosis not present

## 2018-10-07 DIAGNOSIS — Z833 Family history of diabetes mellitus: Secondary | ICD-10-CM | POA: Diagnosis not present

## 2018-10-07 DIAGNOSIS — E782 Mixed hyperlipidemia: Secondary | ICD-10-CM | POA: Diagnosis not present

## 2018-10-07 DIAGNOSIS — E069 Thyroiditis, unspecified: Secondary | ICD-10-CM | POA: Diagnosis not present

## 2018-10-14 ENCOUNTER — Encounter (HOSPITAL_COMMUNITY): Payer: Self-pay

## 2018-10-18 DIAGNOSIS — I10 Essential (primary) hypertension: Secondary | ICD-10-CM | POA: Diagnosis not present

## 2018-10-18 DIAGNOSIS — J0110 Acute frontal sinusitis, unspecified: Secondary | ICD-10-CM | POA: Diagnosis not present

## 2018-10-21 DIAGNOSIS — R7303 Prediabetes: Secondary | ICD-10-CM | POA: Diagnosis not present

## 2018-10-21 DIAGNOSIS — M810 Age-related osteoporosis without current pathological fracture: Secondary | ICD-10-CM | POA: Diagnosis not present

## 2018-10-21 DIAGNOSIS — E042 Nontoxic multinodular goiter: Secondary | ICD-10-CM | POA: Diagnosis not present

## 2018-10-21 DIAGNOSIS — E041 Nontoxic single thyroid nodule: Secondary | ICD-10-CM | POA: Diagnosis not present

## 2018-10-21 DIAGNOSIS — Z6827 Body mass index (BMI) 27.0-27.9, adult: Secondary | ICD-10-CM | POA: Diagnosis not present

## 2018-11-16 DIAGNOSIS — E042 Nontoxic multinodular goiter: Secondary | ICD-10-CM | POA: Diagnosis not present

## 2018-11-16 DIAGNOSIS — D440 Neoplasm of uncertain behavior of thyroid gland: Secondary | ICD-10-CM | POA: Diagnosis not present

## 2018-12-16 ENCOUNTER — Ambulatory Visit: Payer: Self-pay | Admitting: Surgery

## 2018-12-16 NOTE — H&P (Signed)
General Surgery [facility]  ASRA GAMBREL DOB: Jan 09, 1951 Married / Language: English / Race: White Female  History of Present Illness  The patient is a 68 year old female who presents with a thyroid nodule.  CHIEF COMPLAINT: thyroid neoplasm of uncertain behavior  Patient is referred by her endocrinologist, Dr. Boyce Medici, for surgical evaluation and management of thyroid neoplasm of uncertain behavior. Patient had first presented to her primary care physician, Dr. Melinda Crutch, in February 2020. She had experienced left neck pain. As part of her evaluation she underwent a thyroid ultrasound on September 21, 2018. This showed bilateral thyroid nodules with a dominant 2.9 cm nodule in the left thyroid lobe which was felt to be mildly suspicious. Subsequent fine-needle aspiration biopsy demonstrated cytologic atypia, Bethesda Category III, and the specimen was submitted for [facility] testing. Results were suspicious, rendering a risk of malignancy of 50%. Patient has a normal TSH level of 1.11. She has no prior history of thyroid disease. She has never been on thyroid medication. She has had no prior head and neck surgery. There is a history of hypothyroidism in the patient's sister and there is a history of throat cancer in the patient's maternal grandmother. An exact diagnosis was not available. Patient has had mild occasional dysphagia. She has required upper endoscopy and esophageal dilatation due to the presence of a ring and hiatal hernia. Patient presents today to discuss possible thyroid surgery for definitive diagnosis.   Past Surgical History  Appendectomy  Cesarean Section - 1  Colon Polyp Removal - Colonoscopy  Hysterectomy (not due to cancer) - Partial  Oral Surgery  Spinal Surgery - Lower Back   Diagnostic Studies History  Colonoscopy  within last year Mammogram  within last year Pap Smear  1-5 years ago  Allergies  Penicillins   SHELLFISH  Tetracycline *CHEMICALS*  Betadine *ANTISEPTICS & DISINFECTANTS*  Allergies Reconciled   Medication History Simvastatin (20MG  Tablet, Oral) Active. Biotin (10MG  Tablet, Oral) Active. CoQ10 (200MG  Capsule, Oral) Active. Prolia (60MG /ML Solution, Subcutaneous) Active. Vitamin D (25 MCG(1000 UT) Tablet, Oral) Active. Medications Reconciled  Social History Alcohol use  Occasional alcohol use. Caffeine use  Tea. No drug use  Tobacco use  Never smoker.  Family History  Breast Cancer  Family Members In General. Cancer  Family Members In General, Mother. Cerebrovascular Accident  Father. Diabetes Mellitus  Father. Heart Disease  Father. Heart disease in female family member before age 63  Hypertension  Father. Kidney Disease  Father. Migraine Headache  Sister. Thyroid problems  Sister.  Pregnancy / Birth History  Age at menarche  49 years. Age of menopause  42-50 Contraceptive History  Contraceptive implant. Gravida  2 Length (months) of breastfeeding  3-6 Maternal age  55-30 Para  2  Other Problems Back Pain  Depression  Gastroesophageal Reflux Disease  Hypercholesterolemia  Other disease, cancer, significant illness   Review of Systems General Not Present- Appetite Loss, Chills, Fatigue, Fever, Night Sweats, Weight Gain and Weight Loss. Skin Not Present- Change in Wart/Mole, Dryness, Hives, Jaundice, New Lesions, Non-Healing Wounds, Rash and Ulcer. HEENT Present- Seasonal Allergies. Not Present- Earache, Hearing Loss, Hoarseness, Nose Bleed, Oral Ulcers, Ringing in the Ears, Sinus Pain, Sore Throat, Visual Disturbances, Wears glasses/contact lenses and Yellow Eyes. Respiratory Not Present- Bloody sputum, Chronic Cough, Difficulty Breathing, Snoring and Wheezing. Cardiovascular Not Present- Chest Pain, Difficulty Breathing Lying Down, Leg Cramps, Palpitations, Rapid Heart Rate, Shortness of Breath and Swelling of  Extremities. Gastrointestinal Not Present- Abdominal  Pain, Bloating, Bloody Stool, Change in Bowel Habits, Chronic diarrhea, Constipation, Difficulty Swallowing, Excessive gas, Gets full quickly at meals, Hemorrhoids, Indigestion, Nausea, Rectal Pain and Vomiting. Female Genitourinary Not Present- Frequency, Nocturia, Painful Urination, Pelvic Pain and Urgency. Musculoskeletal Not Present- Back Pain, Joint Pain, Joint Stiffness, Muscle Pain, Muscle Weakness and Swelling of Extremities. Neurological Not Present- Decreased Memory, Fainting, Headaches, Numbness, Seizures, Tingling, Tremor, Trouble walking and Weakness. Psychiatric Not Present- Anxiety, Bipolar, Change in Sleep Pattern, Depression, Fearful and Frequent crying. Endocrine Not Present- Cold Intolerance, Excessive Hunger, Hair Changes, Heat Intolerance, Hot flashes and New Diabetes. Hematology Not Present- Blood Thinners, Easy Bruising, Excessive bleeding, Gland problems, HIV and Persistent Infections.  Vitals Weight: 141.8 lb Height: 61.5in Body Surface Area: 1.64 m Body Mass Index: 26.36 kg/m  Temp.: 77F  Pulse: 95 (Regular)  BP: 132/84 (Sitting, Left Arm, Standard)  Physical Exam  See vital signs recorded above  GENERAL APPEARANCE Development: normal Nutritional status: normal Gross deformities: none  SKIN Rash, lesions, ulcers: none Induration, erythema: none Nodules: none palpable  EYES Conjunctiva and lids: normal Pupils: equal and reactive Iris: normal bilaterally  EARS, NOSE, MOUTH, THROAT External ears: no lesion or deformity External nose: no lesion or deformity Hearing: grossly normal Lips: no lesion or deformity Dentition: normal for age Oral mucosa: moist  NECK Symmetric: yes Trachea: midline Thyroid: No palpable nodules in the right thyroid lobe. Palpation of the left thyroid lobe with swallowing maneuver demonstrates a soft approximately 2-3 cm nodule in the left lobe, mobile with  swallowing, nontender. No palpable lymphadenopathy. Voice quality is normal.  CHEST Respiratory effort: normal Retraction or accessory muscle use: no Breath sounds: normal bilaterally Rales, rhonchi, wheeze: none  CARDIOVASCULAR Auscultation: regular rhythm, normal rate Murmurs: none Pulses: carotid and radial pulse 2+ palpable Lower extremity edema: none Lower extremity varicosities: none  MUSCULOSKELETAL Station and gait: normal Digits and nails: no clubbing or cyanosis Muscle strength: grossly normal all extremities Range of motion: grossly normal all extremities Deformity: none  LYMPHATIC Cervical: none palpable Supraclavicular: none palpable  PSYCHIATRIC Oriented to person, place, and time: yes Mood and affect: normal for situation Judgment and insight: appropriate for situation    Assessment & Plan  NEOPLASM OF UNCERTAIN BEHAVIOR OF THYROID GLAND (D44.0)  Pt Education - Pamphlet Given - The Thyroid Book: discussed with patient and provided information.  Patient presents on referral from her primary care physician and her endocrinologist for consideration for thyroidectomy for definitive diagnosis of thyroid neoplasm of uncertain behavior. Patient is provided with written literature on thyroid surgery to review at home.  Patient has a 2.9 cm mass in the left thyroid lobe. Biopsy shows cytologic atypia. Additional molecular genetic testing demonstrates findings suspicious for malignancy with a malignancy rate of approximately 50%. Therefore the patient is recommended to undergo total thyroidectomy given the fact that she has bilateral thyroid nodules and, in the event of malignancy, would require radioactive iodine treatment. We discussed the risk and benefits of surgery including the risk of recurrent laryngeal nerve injury and injury to parathyroid glands. We discussed the hospital stay to be anticipated. We discussed the need for lifelong thyroid hormone  replacement. We discussed the potential need for radioactive iodine treatment. Patient understands and wishes to proceed with surgery in the near future.  At this time, all elective surgery and semi-elective surgery is delayed due to the Covid-19 virus situation. As soon as the operating room so allow for scheduling of this type of case, we will contact the patient  and make arrangements. Patient will see anesthesia for evaluation approximately 4-5 days prior to surgery. She will come into the hospital on the day of her procedure. She will have a one night hospital stay. Most patients are discharged home the following morning.  The risks and benefits of the procedure have been discussed at length with the patient. The patient understands the proposed procedure, potential alternative treatments, and the course of recovery to be expected. All of the patient's questions have been answered at this time. The patient wishes to proceed with surgery.  Armandina Gemma, [facility] Office: [phone]

## 2019-01-05 ENCOUNTER — Encounter: Payer: Self-pay | Admitting: Surgery

## 2019-01-05 ENCOUNTER — Ambulatory Visit: Payer: Self-pay | Admitting: Surgery

## 2019-01-05 DIAGNOSIS — D440 Neoplasm of uncertain behavior of thyroid gland: Secondary | ICD-10-CM | POA: Diagnosis present

## 2019-01-05 NOTE — H&P (Signed)
General Surgery [facility]  Alison [name] DOB: 07/03/1951 Married / Language: English / Race: White Female   History of Present Illness  The patient is a 68 year old female who presents with a thyroid nodule.  CHIEF COMPLAINT: thyroid neoplasm of uncertain behavior  Patient is referred by her endocrinologist, Dr. Boyce Medici, for surgical evaluation and management of thyroid neoplasm of uncertain behavior. Patient had first presented to her primary care physician, Dr. Melinda Crutch, in February 2020. She had experienced left neck pain. As part of her evaluation she underwent a thyroid ultrasound on September 21, 2018. This showed bilateral thyroid nodules with a dominant 2.9 cm nodule in the left thyroid lobe which was felt to be mildly suspicious. Subsequent fine-needle aspiration biopsy demonstrated cytologic atypia, Bethesda Category III, and the specimen was submitted for [facility] testing. Results were suspicious, rendering a risk of malignancy of 50%. Patient has a normal TSH level of 1.11. She has no prior history of thyroid disease. She has never been on thyroid medication. She has had no prior head and neck surgery. There is a history of hypothyroidism in the patient's sister and there is a history of throat cancer in the patient's maternal grandmother. An exact diagnosis was not available. Patient has had mild occasional dysphagia. She has required upper endoscopy and esophageal dilatation due to the presence of a ring and hiatal hernia. Patient presents today to discuss possible thyroid surgery for definitive diagnosis.   Past Surgical History Appendectomy  Cesarean Section - 1  Colon Polyp Removal - Colonoscopy  Hysterectomy (not due to cancer) - Partial  Oral Surgery  Spinal Surgery - Lower Back   Diagnostic Studies History Colonoscopy  within last year Mammogram  within last year Pap Smear  1-5 years ago  Allergies Penicillins   SHELLFISH  Tetracycline *CHEMICALS*  Betadine *ANTISEPTICS & DISINFECTANTS*  Allergies Reconciled   Medication History Simvastatin (20MG  Tablet, Oral) Active. Biotin (10MG  Tablet, Oral) Active. CoQ10 (200MG  Capsule, Oral) Active. Prolia (60MG /ML Solution, Subcutaneous) Active. Vitamin D (25 MCG(1000 UT) Tablet, Oral) Active. Medications Reconciled  Social History Alcohol use  Occasional alcohol use. Caffeine use  Tea. No drug use  Tobacco use  Never smoker.  Family History Breast Cancer  Family Members In General. Cancer  Family Members In General, Mother. Cerebrovascular Accident  Father. Diabetes Mellitus  Father. Heart Disease  Father. Heart disease in female family member before age 64  Hypertension  Father. Kidney Disease  Father. Migraine Headache  Sister. Thyroid problems  Sister.  Pregnancy / Birth History Age at menarche  72 years. Age of menopause  74-50 Contraceptive History  Contraceptive implant. Gravida  2 Length (months) of breastfeeding  3-6 Maternal age  6-30 Para  2  Other Problems Back Pain  Depression  Gastroesophageal Reflux Disease  Hypercholesterolemia  Other disease, cancer, significant illness    Review of Systems  General Not Present- Appetite Loss, Chills, Fatigue, Fever, Night Sweats, Weight Gain and Weight Loss. Skin Not Present- Change in Wart/Mole, Dryness, Hives, Jaundice, New Lesions, Non-Healing Wounds, Rash and Ulcer. HEENT Present- Seasonal Allergies. Not Present- Earache, Hearing Loss, Hoarseness, Nose Bleed, Oral Ulcers, Ringing in the Ears, Sinus Pain, Sore Throat, Visual Disturbances, Wears glasses/contact lenses and Yellow Eyes. Respiratory Not Present- Bloody sputum, Chronic Cough, Difficulty Breathing, Snoring and Wheezing. Cardiovascular Not Present- Chest Pain, Difficulty Breathing Lying Down, Leg Cramps, Palpitations, Rapid Heart Rate, Shortness of Breath and Swelling of  Extremities. Gastrointestinal Not Present- Abdominal Pain, Bloating,  Bloody Stool, Change in Bowel Habits, Chronic diarrhea, Constipation, Difficulty Swallowing, Excessive gas, Gets full quickly at meals, Hemorrhoids, Indigestion, Nausea, Rectal Pain and Vomiting. Female Genitourinary Not Present- Frequency, Nocturia, Painful Urination, Pelvic Pain and Urgency. Musculoskeletal Not Present- Back Pain, Joint Pain, Joint Stiffness, Muscle Pain, Muscle Weakness and Swelling of Extremities. Neurological Not Present- Decreased Memory, Fainting, Headaches, Numbness, Seizures, Tingling, Tremor, Trouble walking and Weakness. Psychiatric Not Present- Anxiety, Bipolar, Change in Sleep Pattern, Depression, Fearful and Frequent crying. Endocrine Not Present- Cold Intolerance, Excessive Hunger, Hair Changes, Heat Intolerance, Hot flashes and New Diabetes. Hematology Not Present- Blood Thinners, Easy Bruising, Excessive bleeding, Gland problems, HIV and Persistent Infections.  Vitals Weight: 141.8 lb Height: 61.5in Body Surface Area: 1.64 m Body Mass Index: 26.36 kg/m  Temp.: 66F  Pulse: 95 (Regular)  BP: 132/84 (Sitting, Left Arm, Standard)   Physical Exam  See vital signs recorded above  GENERAL APPEARANCE Development: normal Nutritional status: normal Gross deformities: none  SKIN Rash, lesions, ulcers: none Induration, erythema: none Nodules: none palpable  EYES Conjunctiva and lids: normal Pupils: equal and reactive Iris: normal bilaterally  EARS, NOSE, MOUTH, THROAT External ears: no lesion or deformity External nose: no lesion or deformity Hearing: grossly normal Lips: no lesion or deformity Dentition: normal for age Oral mucosa: moist  NECK Symmetric: yes Trachea: midline Thyroid: No palpable nodules in the right thyroid lobe. Palpation of the left thyroid lobe with swallowing maneuver demonstrates a soft approximately 2-3 cm nodule in the left lobe, mobile with  swallowing, nontender. No palpable lymphadenopathy. Voice quality is normal.  CHEST Respiratory effort: normal Retraction or accessory muscle use: no Breath sounds: normal bilaterally Rales, rhonchi, wheeze: none  CARDIOVASCULAR Auscultation: regular rhythm, normal rate Murmurs: none Pulses: carotid and radial pulse 2+ palpable Lower extremity edema: none Lower extremity varicosities: none  MUSCULOSKELETAL Station and gait: normal Digits and nails: no clubbing or cyanosis Muscle strength: grossly normal all extremities Range of motion: grossly normal all extremities Deformity: none  LYMPHATIC Cervical: none palpable Supraclavicular: none palpable  PSYCHIATRIC Oriented to person, place, and time: yes Mood and affect: normal for situation Judgment and insight: appropriate for situation    Assessment & Plan  NEOPLASM OF UNCERTAIN BEHAVIOR OF THYROID GLAND (D44.0)  Pt Education - Pamphlet Given - The Thyroid Book: discussed with patient and provided information.  Patient presents on referral from her primary care physician and her endocrinologist for consideration for thyroidectomy for definitive diagnosis of thyroid neoplasm of uncertain behavior. Patient is provided with written literature on thyroid surgery to review at home.  Patient has a 2.9 cm mass in the left thyroid lobe. Biopsy shows cytologic atypia. Additional molecular genetic testing demonstrates findings suspicious for malignancy with a malignancy rate of approximately 50%. Therefore the patient is recommended to undergo total thyroidectomy given the fact that she has bilateral thyroid nodules and, in the event of malignancy, would require radioactive iodine treatment. We discussed the risk and benefits of surgery including the risk of recurrent laryngeal nerve injury and injury to parathyroid glands. We discussed the hospital stay to be anticipated. We discussed the need for lifelong thyroid hormone  replacement. We discussed the potential need for radioactive iodine treatment. Patient understands and wishes to proceed with surgery in the near future.  At this time, all elective surgery and semi-elective surgery is delayed due to the Covid-19 virus situation. As soon as the operating room so allow for scheduling of this type of case, we will contact the patient and  make arrangements. Patient will see anesthesia for evaluation approximately 4-5 days prior to surgery. She will come into the hospital on the day of her procedure. She will have a one night hospital stay. Most patients are discharged home the following morning.  The risks and benefits of the procedure have been discussed at length with the patient. The patient understands the proposed procedure, potential alternative treatments, and the course of recovery to be expected. All of the patient's questions have been answered at this time. The patient wishes to proceed with surgery.  Armandina Gemma, [place] Office: [phone]

## 2019-01-08 DIAGNOSIS — M810 Age-related osteoporosis without current pathological fracture: Secondary | ICD-10-CM | POA: Diagnosis not present

## 2019-01-12 DIAGNOSIS — Z23 Encounter for immunization: Secondary | ICD-10-CM | POA: Diagnosis not present

## 2019-01-12 DIAGNOSIS — Z0000 Encounter for general adult medical examination without abnormal findings: Secondary | ICD-10-CM | POA: Diagnosis not present

## 2019-01-14 DIAGNOSIS — F430 Acute stress reaction: Secondary | ICD-10-CM | POA: Diagnosis not present

## 2019-01-14 DIAGNOSIS — M810 Age-related osteoporosis without current pathological fracture: Secondary | ICD-10-CM | POA: Diagnosis not present

## 2019-01-14 DIAGNOSIS — I10 Essential (primary) hypertension: Secondary | ICD-10-CM | POA: Diagnosis not present

## 2019-01-14 DIAGNOSIS — E559 Vitamin D deficiency, unspecified: Secondary | ICD-10-CM | POA: Diagnosis not present

## 2019-01-14 DIAGNOSIS — K219 Gastro-esophageal reflux disease without esophagitis: Secondary | ICD-10-CM | POA: Diagnosis not present

## 2019-01-14 DIAGNOSIS — E782 Mixed hyperlipidemia: Secondary | ICD-10-CM | POA: Diagnosis not present

## 2019-01-22 ENCOUNTER — Other Ambulatory Visit (HOSPITAL_COMMUNITY): Payer: Self-pay | Admitting: *Deleted

## 2019-01-22 NOTE — Patient Instructions (Addendum)
Alison [name]    Your procedure is scheduled on: 01-28-2019  Report to [facility] Main  Entrance  Report to admitting at 1100 AM   Alison [name] 20 TEST ON TODAY, THIS TEST MUST BE DONE BEFORE SURGERY, COME TO [place].    Call this number if you have problems the morning of surgery [phone]   Remember: Do not eat food  :After Midnight. CLEAR LIQUIDS FROM MIDNIGHT UNTIL 700 AM. NOTHING BY MOUTH AFTER 700 AM.   BRUSH YOUR TEETH MORNING OF SURGERY AND RINSE YOUR MOUTH OUT, NO CHEWING GUM CANDY OR MINTS.     Take these medicines the morning of surgery with A SIP OF WATER: BUPROPION (WELLBUTRIN), SIMVASTATIN (ZOCOR)                                 You may not have any metal on your body including hair pins and              piercings  Do not wear jewelry, make-up, lotions, powders or perfumes, deodorant             Do not wear nail polish.  Do not shave  48 hours prior to surgery.                Do not bring valuables to the hospital. Alison [name].  Contacts, dentures or bridgework may not be worn into surgery.  Leave suitcase in the car. After surgery it may be brought to your room.     _____________________________________________________________________             [name] - Preparing for Surgery Before surgery, you can play an important role.  Because skin is not sterile, your skin needs to be as free of germs as possible.  You can reduce the number of germs on your skin by washing with CHG (chlorahexidine gluconate) soap before surgery.  CHG is an antiseptic cleaner which kills germs and bonds with the skin to continue killing germs even after washing. Please DO NOT use if you have an allergy to CHG or antibacterial soaps.  If your skin becomes reddened/irritated stop using the CHG and inform your nurse when you arrive at Short Stay. Do not shave  (including legs and underarms) for at least 48 hours prior to the first CHG shower.  You may shave your face/neck. Please follow these instructions carefully:  1.  Shower with CHG Soap the night before surgery and the  morning of Surgery.  2.  If you choose to wash your hair, wash your hair first as usual with your  normal  shampoo.  3.  After you shampoo, rinse your hair and body thoroughly to remove the  shampoo.                           4.  Use CHG as you would any other liquid soap.  You can apply chg directly  to the skin and wash                       Gently with a scrungie or clean washcloth.  5.  Apply the CHG Soap to your body ONLY FROM THE NECK DOWN.   Do not use on face/ open                           Wound or open sores. Avoid contact with eyes, ears mouth and genitals (private parts).                       Wash face,  Genitals (private parts) with your normal soap.             6.  Wash thoroughly, paying special attention to the area where your surgery  will be performed.  7.  Thoroughly rinse your body with warm water from the neck down.  8.  DO NOT shower/wash with your normal soap after using and rinsing off  the CHG Soap.                9.  Pat yourself dry with a clean towel.            10.  Wear clean pajamas.            11.  Place clean sheets on your bed the night of your first shower and do not  sleep with pets. Day of Surgery : Do not apply any lotions/deodorants the morning of surgery.  Please wear clean clothes to the hospital/surgery center.  FAILURE TO FOLLOW THESE INSTRUCTIONS MAY RESULT IN THE CANCELLATION OF YOUR SURGERY PATIENT SIGNATURE_________________________________  NURSE SIGNATURE__________________________________  ________________________________________________________________________

## 2019-01-25 ENCOUNTER — Other Ambulatory Visit: Payer: Self-pay

## 2019-01-25 ENCOUNTER — Encounter (HOSPITAL_COMMUNITY): Payer: Self-pay

## 2019-01-25 ENCOUNTER — Other Ambulatory Visit (HOSPITAL_COMMUNITY)
Admission: RE | Admit: 2019-01-25 | Discharge: 2019-01-25 | Disposition: A | Discharge status: Home / Self Care | Payer: PPO | Source: Ambulatory Visit | Attending: Surgery | Admitting: Surgery

## 2019-01-25 ENCOUNTER — Encounter (HOSPITAL_COMMUNITY)
Admission: RE | Admit: 2019-01-25 | Discharge: 2019-01-25 | Disposition: A | Discharge status: Home / Self Care | Payer: PPO | Source: Ambulatory Visit | Attending: Surgery | Admitting: Surgery

## 2019-01-25 DIAGNOSIS — E042 Nontoxic multinodular goiter: Secondary | ICD-10-CM | POA: Diagnosis present

## 2019-01-25 DIAGNOSIS — C73 Malignant neoplasm of thyroid gland: Secondary | ICD-10-CM | POA: Diagnosis not present

## 2019-01-25 DIAGNOSIS — Z01818 Encounter for other preprocedural examination: Secondary | ICD-10-CM | POA: Insufficient documentation

## 2019-01-25 DIAGNOSIS — F329 Major depressive disorder, single episode, unspecified: Secondary | ICD-10-CM | POA: Diagnosis not present

## 2019-01-25 DIAGNOSIS — Z79899 Other long term (current) drug therapy: Secondary | ICD-10-CM | POA: Diagnosis not present

## 2019-01-25 DIAGNOSIS — K219 Gastro-esophageal reflux disease without esophagitis: Secondary | ICD-10-CM | POA: Diagnosis not present

## 2019-01-25 DIAGNOSIS — Z01812 Encounter for preprocedural laboratory examination: Secondary | ICD-10-CM | POA: Diagnosis not present

## 2019-01-25 DIAGNOSIS — Z01810 Encounter for preprocedural cardiovascular examination: Secondary | ICD-10-CM | POA: Diagnosis not present

## 2019-01-25 DIAGNOSIS — Z888 Allergy status to other drugs, medicaments and biological substances status: Secondary | ICD-10-CM | POA: Diagnosis not present

## 2019-01-25 DIAGNOSIS — Z91013 Allergy to seafood: Secondary | ICD-10-CM | POA: Diagnosis not present

## 2019-01-25 DIAGNOSIS — E7800 Pure hypercholesterolemia, unspecified: Secondary | ICD-10-CM | POA: Diagnosis not present

## 2019-01-25 DIAGNOSIS — Z1159 Encounter for screening for other viral diseases: Secondary | ICD-10-CM | POA: Insufficient documentation

## 2019-01-25 HISTORY — DX: Depression, unspecified: F32.A

## 2019-01-25 LAB — CBC
HCT: 44.2 % (ref 36.0–46.0)
Hemoglobin: 14 g/dL (ref 12.0–15.0)
MCH: 28.3 pg (ref 26.0–34.0)
MCHC: 31.7 g/dL (ref 30.0–36.0)
MCV: 89.5 fL (ref 80.0–100.0)
Platelets: 231 10*3/uL (ref 150–400)
RBC: 4.94 MIL/uL (ref 3.87–5.11)
RDW: 13.4 % (ref 11.5–15.5)
WBC: 7.4 10*3/uL (ref 4.0–10.5)
nRBC: 0 % (ref 0.0–0.2)

## 2019-01-25 LAB — BASIC METABOLIC PANEL
Anion gap: 7 (ref 5–15)
BUN: 18 mg/dL (ref 8–23)
CO2: 28 mmol/L (ref 22–32)
Calcium: 9.7 mg/dL (ref 8.9–10.3)
Chloride: 106 mmol/L (ref 98–111)
Creatinine, Ser: 0.71 mg/dL (ref 0.44–1.00)
GFR calc Af Amer: >60 mL/min (ref >60)
GFR calc non Af Amer: >60 mL/min (ref >60)
Glucose, Bld: 99 mg/dL (ref 70–99)
Potassium: 4.4 mmol/L (ref 3.5–5.1)
Sodium: 141 mmol/L (ref 135–145)

## 2019-01-25 NOTE — Progress Notes (Signed)
Message left for Alison [name] inquiring if she is still planning to arrive for her Covid screen prior to her 01/28/19 procedure.

## 2019-01-27 ENCOUNTER — Encounter (HOSPITAL_COMMUNITY): Payer: Self-pay | Admitting: Surgery

## 2019-01-27 LAB — NOVEL CORONAVIRUS, NAA (HOSP ORDER, SEND-OUT TO REF LAB; TAT 18-24 HRS): SARS-CoV-2, NAA: NOT DETECTED

## 2019-01-28 ENCOUNTER — Observation Stay (HOSPITAL_COMMUNITY)
Admission: RE | Admit: 2019-01-28 | Discharge: 2019-01-29 | Disposition: A | Discharge status: Home / Self Care | Payer: PPO | Attending: Surgery | Admitting: Surgery

## 2019-01-28 ENCOUNTER — Other Ambulatory Visit: Payer: Self-pay

## 2019-01-28 ENCOUNTER — Ambulatory Visit (HOSPITAL_COMMUNITY): Payer: PPO | Admitting: Anesthesiology

## 2019-01-28 ENCOUNTER — Encounter (HOSPITAL_COMMUNITY)
Admission: RE | Disposition: A | Discharge status: Home / Self Care | Payer: Self-pay | Source: Home / Self Care | Attending: Surgery

## 2019-01-28 ENCOUNTER — Encounter (HOSPITAL_COMMUNITY): Payer: Self-pay | Admitting: Anesthesiology

## 2019-01-28 ENCOUNTER — Ambulatory Visit (HOSPITAL_COMMUNITY): Payer: PPO | Admitting: Physician Assistant

## 2019-01-28 DIAGNOSIS — Z01812 Encounter for preprocedural laboratory examination: Secondary | ICD-10-CM | POA: Insufficient documentation

## 2019-01-28 DIAGNOSIS — Z1159 Encounter for screening for other viral diseases: Secondary | ICD-10-CM | POA: Diagnosis not present

## 2019-01-28 DIAGNOSIS — Z01810 Encounter for preprocedural cardiovascular examination: Secondary | ICD-10-CM | POA: Diagnosis not present

## 2019-01-28 DIAGNOSIS — Z79899 Other long term (current) drug therapy: Secondary | ICD-10-CM | POA: Diagnosis not present

## 2019-01-28 DIAGNOSIS — K219 Gastro-esophageal reflux disease without esophagitis: Secondary | ICD-10-CM | POA: Diagnosis not present

## 2019-01-28 DIAGNOSIS — M1990 Unspecified osteoarthritis, unspecified site: Secondary | ICD-10-CM | POA: Diagnosis not present

## 2019-01-28 DIAGNOSIS — Z91013 Allergy to seafood: Secondary | ICD-10-CM | POA: Insufficient documentation

## 2019-01-28 DIAGNOSIS — F329 Major depressive disorder, single episode, unspecified: Secondary | ICD-10-CM | POA: Diagnosis not present

## 2019-01-28 DIAGNOSIS — C73 Malignant neoplasm of thyroid gland: Secondary | ICD-10-CM | POA: Diagnosis not present

## 2019-01-28 DIAGNOSIS — I10 Essential (primary) hypertension: Secondary | ICD-10-CM | POA: Diagnosis not present

## 2019-01-28 DIAGNOSIS — Z888 Allergy status to other drugs, medicaments and biological substances status: Secondary | ICD-10-CM | POA: Insufficient documentation

## 2019-01-28 DIAGNOSIS — D440 Neoplasm of uncertain behavior of thyroid gland: Secondary | ICD-10-CM

## 2019-01-28 DIAGNOSIS — I209 Angina pectoris, unspecified: Secondary | ICD-10-CM | POA: Diagnosis not present

## 2019-01-28 DIAGNOSIS — E7800 Pure hypercholesterolemia, unspecified: Secondary | ICD-10-CM | POA: Diagnosis not present

## 2019-01-28 HISTORY — PX: THYROIDECTOMY: SHX17

## 2019-01-28 SURGERY — THYROIDECTOMY
Anesthesia: General

## 2019-01-28 MED ORDER — LACTATED RINGERS IV SOLN: INTRAVENOUS | Status: DC

## 2019-01-28 MED ORDER — FENTANYL CITRATE (PF) 100 MCG/2ML IJ SOLN
INTRAMUSCULAR | Status: DC | PRN
  Administered 2019-01-28 (×4): 50 ug via INTRAVENOUS

## 2019-01-28 MED ORDER — HYDROCODONE-ACETAMINOPHEN 5-325 MG PO TABS
1.0000 | ORAL_TABLET | ORAL | Status: DC | PRN
  Administered 2019-01-29: 1 via ORAL
  Filled 2019-01-28: qty 1

## 2019-01-28 MED ORDER — DEXAMETHASONE SODIUM PHOSPHATE 10 MG/ML IJ SOLN
INTRAMUSCULAR | Status: DC | PRN
  Administered 2019-01-28: 10 mg via INTRAVENOUS

## 2019-01-28 MED ORDER — ACETAMINOPHEN 325 MG PO TABS
650.0000 mg | ORAL_TABLET | Freq: Four times a day (QID) | ORAL | Status: DC | PRN
  Administered 2019-01-28 – 2019-01-29 (×2): 650 mg via ORAL
  Filled 2019-01-28 (×2): qty 2

## 2019-01-28 MED ORDER — ACETAMINOPHEN 10 MG/ML IV SOLN
INTRAVENOUS | Status: AC
  Filled 2019-01-28: qty 100

## 2019-01-28 MED ORDER — SUCCINYLCHOLINE CHLORIDE 200 MG/10ML IV SOSY
PREFILLED_SYRINGE | INTRAVENOUS | Status: DC | PRN
  Administered 2019-01-28: 100 mg via INTRAVENOUS

## 2019-01-28 MED ORDER — ONDANSETRON HCL 4 MG/2ML IJ SOLN
INTRAMUSCULAR | Status: DC | PRN
  Administered 2019-01-28: 4 mg via INTRAVENOUS

## 2019-01-28 MED ORDER — AMLODIPINE BESY-BENAZEPRIL HCL 5-10 MG PO CAPS: 1.0000 | ORAL_CAPSULE | ORAL | Status: DC

## 2019-01-28 MED ORDER — CIPROFLOXACIN IN D5W 400 MG/200ML IV SOLN
400.0000 mg | INTRAVENOUS | Status: AC
  Administered 2019-01-28: 400 mg via INTRAVENOUS
  Filled 2019-01-28: qty 200

## 2019-01-28 MED ORDER — CO Q-10 200 MG PO CAPS: 200.0000 mg | ORAL_CAPSULE | Freq: Every day | ORAL | Status: DC

## 2019-01-28 MED ORDER — AMLODIPINE BESYLATE 5 MG PO TABS
5.0000 mg | ORAL_TABLET | ORAL | Status: DC
  Administered 2019-01-29: 5 mg via ORAL
  Filled 2019-01-28: qty 1

## 2019-01-28 MED ORDER — ONDANSETRON HCL 4 MG/2ML IJ SOLN
4.0000 mg | Freq: Four times a day (QID) | INTRAMUSCULAR | Status: DC | PRN
  Administered 2019-01-28 – 2019-01-29 (×2): 4 mg via INTRAVENOUS
  Filled 2019-01-28 (×2): qty 2

## 2019-01-28 MED ORDER — LACTATED RINGERS IV SOLN
INTRAVENOUS | Status: DC
  Administered 2019-01-28: 12:00:00 via INTRAVENOUS

## 2019-01-28 MED ORDER — HYDROMORPHONE HCL 1 MG/ML IJ SOLN
0.2500 mg | INTRAMUSCULAR | Status: DC | PRN
  Administered 2019-01-28: 0.25 mg via INTRAVENOUS
  Administered 2019-01-28: 15:00:00 0.5 mg via INTRAVENOUS
  Administered 2019-01-28: 16:00:00 0.25 mg via INTRAVENOUS
  Administered 2019-01-28: 0.5 mg via INTRAVENOUS

## 2019-01-28 MED ORDER — BUPIVACAINE-EPINEPHRINE (PF) 0.25% -1:200000 IJ SOLN
INTRAMUSCULAR | Status: AC
  Filled 2019-01-28: qty 30

## 2019-01-28 MED ORDER — PROPOFOL 10 MG/ML IV BOLUS
INTRAVENOUS | Status: DC | PRN
  Administered 2019-01-28: 150 mg via INTRAVENOUS

## 2019-01-28 MED ORDER — MIDAZOLAM HCL 2 MG/2ML IJ SOLN
INTRAMUSCULAR | Status: AC
  Filled 2019-01-28: qty 2

## 2019-01-28 MED ORDER — SUGAMMADEX SODIUM 200 MG/2ML IV SOLN
INTRAVENOUS | Status: DC | PRN
  Administered 2019-01-28: 130 mg via INTRAVENOUS

## 2019-01-28 MED ORDER — ONDANSETRON HCL 4 MG/2ML IJ SOLN
INTRAMUSCULAR | Status: AC
  Filled 2019-01-28: qty 2

## 2019-01-28 MED ORDER — HYDROMORPHONE HCL 1 MG/ML IJ SOLN
INTRAMUSCULAR | Status: AC
  Administered 2019-01-28: 0.5 mg via INTRAVENOUS
  Filled 2019-01-28: qty 1

## 2019-01-28 MED ORDER — ROCURONIUM BROMIDE 50 MG/5ML IV SOSY
PREFILLED_SYRINGE | INTRAVENOUS | Status: DC | PRN
  Administered 2019-01-28: 40 mg via INTRAVENOUS

## 2019-01-28 MED ORDER — ACETAMINOPHEN 10 MG/ML IV SOLN
1000.0000 mg | Freq: Once | INTRAVENOUS | Status: DC | PRN
  Administered 2019-01-28: 1000 mg via INTRAVENOUS

## 2019-01-28 MED ORDER — SUGAMMADEX SODIUM 200 MG/2ML IV SOLN
INTRAVENOUS | Status: AC
  Filled 2019-01-28: qty 4

## 2019-01-28 MED ORDER — ACETAMINOPHEN 650 MG RE SUPP: 650.0000 mg | Freq: Four times a day (QID) | RECTAL | Status: DC | PRN

## 2019-01-28 MED ORDER — 0.9 % SODIUM CHLORIDE (POUR BTL) OPTIME
TOPICAL | Status: DC | PRN
  Administered 2019-01-28: 1000 mL

## 2019-01-28 MED ORDER — MIDAZOLAM HCL 5 MG/5ML IJ SOLN
INTRAMUSCULAR | Status: DC | PRN
  Administered 2019-01-28: 1 mg via INTRAVENOUS

## 2019-01-28 MED ORDER — CHLORHEXIDINE GLUCONATE CLOTH 2 % EX PADS: 6.0000 | MEDICATED_PAD | Freq: Once | CUTANEOUS | Status: DC

## 2019-01-28 MED ORDER — SUCCINYLCHOLINE CHLORIDE 200 MG/10ML IV SOSY
PREFILLED_SYRINGE | INTRAVENOUS | Status: AC
  Filled 2019-01-28: qty 10

## 2019-01-28 MED ORDER — ACETAMINOPHEN 325 MG PO TABS: 325.0000 mg | ORAL_TABLET | Freq: Once | ORAL | Status: DC | PRN

## 2019-01-28 MED ORDER — DEXAMETHASONE SODIUM PHOSPHATE 10 MG/ML IJ SOLN
INTRAMUSCULAR | Status: AC
  Filled 2019-01-28: qty 1

## 2019-01-28 MED ORDER — CALCIUM CARBONATE 1250 (500 CA) MG PO TABS
2.0000 | ORAL_TABLET | Freq: Three times a day (TID) | ORAL | Status: DC
  Administered 2019-01-28 – 2019-01-29 (×3): 1000 mg via ORAL
  Filled 2019-01-28 (×3): qty 1

## 2019-01-28 MED ORDER — MEPERIDINE HCL 50 MG/ML IJ SOLN: 6.2500 mg | INTRAMUSCULAR | Status: DC | PRN

## 2019-01-28 MED ORDER — FENTANYL CITRATE (PF) 250 MCG/5ML IJ SOLN
INTRAMUSCULAR | Status: AC
  Filled 2019-01-28: qty 5

## 2019-01-28 MED ORDER — AMLODIPINE BESYLATE 5 MG PO TABS
5.0000 mg | ORAL_TABLET | Freq: Once | ORAL | Status: DC
  Filled 2019-01-28: qty 1

## 2019-01-28 MED ORDER — LIDOCAINE 2% (20 MG/ML) 5 ML SYRINGE
INTRAMUSCULAR | Status: AC
  Filled 2019-01-28: qty 5

## 2019-01-28 MED ORDER — PROMETHAZINE HCL 25 MG/ML IJ SOLN: 6.2500 mg | INTRAMUSCULAR | Status: DC | PRN

## 2019-01-28 MED ORDER — KCL IN DEXTROSE-NACL 20-5-0.45 MEQ/L-%-% IV SOLN
INTRAVENOUS | Status: DC
  Administered 2019-01-28: 17:00:00 via INTRAVENOUS
  Filled 2019-01-28: qty 1000

## 2019-01-28 MED ORDER — ROCURONIUM BROMIDE 10 MG/ML (PF) SYRINGE
PREFILLED_SYRINGE | INTRAVENOUS | Status: AC
  Filled 2019-01-28: qty 10

## 2019-01-28 MED ORDER — BUPROPION HCL ER (XL) 300 MG PO TB24
300.0000 mg | ORAL_TABLET | Freq: Every day | ORAL | Status: DC
  Administered 2019-01-29: 300 mg via ORAL
  Filled 2019-01-28: qty 1

## 2019-01-28 MED ORDER — LIDOCAINE 2% (20 MG/ML) 5 ML SYRINGE
INTRAMUSCULAR | Status: DC | PRN
  Administered 2019-01-28: 50 mg via INTRAVENOUS

## 2019-01-28 MED ORDER — SUGAMMADEX SODIUM 200 MG/2ML IV SOLN
INTRAVENOUS | Status: AC
  Filled 2019-01-28: qty 2

## 2019-01-28 MED ORDER — BENAZEPRIL HCL 10 MG PO TABS
10.0000 mg | ORAL_TABLET | ORAL | Status: DC
  Administered 2019-01-29: 10 mg via ORAL
  Filled 2019-01-28: qty 1

## 2019-01-28 MED ORDER — HYDROMORPHONE HCL 1 MG/ML IJ SOLN
INTRAMUSCULAR | Status: AC
  Administered 2019-01-28: 0.25 mg via INTRAVENOUS
  Filled 2019-01-28: qty 1

## 2019-01-28 MED ORDER — TRAMADOL HCL 50 MG PO TABS: 50.0000 mg | ORAL_TABLET | Freq: Four times a day (QID) | ORAL | Status: DC | PRN

## 2019-01-28 MED ORDER — HYDROMORPHONE HCL 1 MG/ML IJ SOLN: 1.0000 mg | INTRAMUSCULAR | Status: DC | PRN

## 2019-01-28 MED ORDER — ACETAMINOPHEN 160 MG/5ML PO SOLN: 325.0000 mg | Freq: Once | ORAL | Status: DC | PRN

## 2019-01-28 MED ORDER — PROPOFOL 10 MG/ML IV BOLUS
INTRAVENOUS | Status: AC
  Filled 2019-01-28: qty 20

## 2019-01-28 MED ORDER — ONDANSETRON 4 MG PO TBDP: 4.0000 mg | ORAL_TABLET | Freq: Four times a day (QID) | ORAL | Status: DC | PRN

## 2019-01-28 SURGICAL SUPPLY — 28 items
ATTRACTOMAT 16X20 MAGNETIC DRP (DRAPES) ×2 IMPLANT
BLADE SURG 15 STRL LF DISP TIS (BLADE) ×1 IMPLANT
BLADE SURG 15 STRL SS (BLADE) ×1
CHLORAPREP W/TINT 26 (MISCELLANEOUS) ×2 IMPLANT
CLIP VESOCCLUDE MED 6/CT (CLIP) ×4 IMPLANT
CLIP VESOCCLUDE SM WIDE 6/CT (CLIP) ×14 IMPLANT
COVER SURGICAL LIGHT HANDLE (MISCELLANEOUS) ×2 IMPLANT
COVER WAND RF STERILE (DRAPES) ×2 IMPLANT
DERMABOND ADVANCED (GAUZE/BANDAGES/DRESSINGS) ×1
DERMABOND ADVANCED .7 DNX12 (GAUZE/BANDAGES/DRESSINGS) ×1 IMPLANT
DRAPE LAPAROTOMY T 98X78 PEDS (DRAPES) ×2 IMPLANT
ELECT PENCIL ROCKER SW 15FT (MISCELLANEOUS) ×2 IMPLANT
ELECT REM PT RETURN 15FT ADLT (MISCELLANEOUS) ×2 IMPLANT
GAUZE 4X4 16PLY RFD (DISPOSABLE) ×2 IMPLANT
GLOVE SURG ORTHO 8.0 STRL STRW (GLOVE) ×2 IMPLANT
GOWN STRL REUS W/TWL XL LVL3 (GOWN DISPOSABLE) ×4 IMPLANT
HEMOSTAT SURGICEL 2X4 FIBR (HEMOSTASIS) ×2 IMPLANT
ILLUMINATOR WAVEGUIDE N/F (MISCELLANEOUS) ×2 IMPLANT
KIT BASIN OR (CUSTOM PROCEDURE TRAY) ×2 IMPLANT
KIT TURNOVER KIT A (KITS) IMPLANT
PACK BASIC VI WITH GOWN DISP (CUSTOM PROCEDURE TRAY) ×2 IMPLANT
SHEARS HARMONIC 9CM CVD (BLADE) ×2 IMPLANT
SUT MNCRL AB 4-0 PS2 18 (SUTURE) ×2 IMPLANT
SUT VIC AB 3-0 SH 18 (SUTURE) ×4 IMPLANT
SYR BULB IRRIGATION 50ML (SYRINGE) ×2 IMPLANT
TOWEL OR 17X26 10 PK STRL BLUE (TOWEL DISPOSABLE) ×2 IMPLANT
TOWEL OR NON WOVEN STRL DISP B (DISPOSABLE) ×2 IMPLANT
TUBING CONNECTING 10 (TUBING) ×2 IMPLANT

## 2019-01-28 NOTE — Anesthesia Procedure Notes (Signed)
Procedure Name: Intubation Date/Time: 01/28/2019 1:14 PM Performed by: Maxwell Caul, CRNA Pre-anesthesia Checklist: Patient identified, Emergency Drugs available, Suction available and Patient being monitored Patient Re-evaluated:Patient Re-evaluated prior to induction Oxygen Delivery Method: Circle system utilized Preoxygenation: Pre-oxygenation with 100% oxygen Induction Type: IV induction Laryngoscope Size: Mac and 3 Grade View: Grade I Tube type: Oral Tube size: 7.0 mm Number of attempts: 1 Airway Equipment and Method: Stylet Placement Confirmation: ETT inserted through vocal cords under direct vision,  positive ETCO2 and breath sounds checked- equal and bilateral Secured at: 21 cm Tube secured with: Tape Dental Injury: Teeth and Oropharynx as per pre-operative assessment

## 2019-01-28 NOTE — Anesthesia Preprocedure Evaluation (Addendum)
Anesthesia Evaluation  Patient identified by MRN, date of birth, ID band Patient awake    Reviewed: Allergy & Precautions, NPO status , Patient's Chart, lab work & pertinent test results  Airway Mallampati: II  TM Distance: >3 FB Neck ROM: Full    Dental  (+) Teeth Intact, Dental Advisory Given   Pulmonary neg pulmonary ROS,    breath sounds clear to auscultation       Cardiovascular hypertension, + angina  Rhythm:Regular Rate:Normal     Neuro/Psych Anxiety Depression negative neurological ROS     GI/Hepatic Neg liver ROS, GERD  Medicated,  Endo/Other  negative endocrine ROS  Renal/GU negative Renal ROS     Musculoskeletal  (+) Arthritis ,   Abdominal Normal abdominal exam  (+)   Peds  Hematology negative hematology ROS (+)   Anesthesia Other Findings   Reproductive/Obstetrics                            Lab Results  Component Value Date   WBC 7.4 01/25/2019   HGB 14.0 01/25/2019   HCT 44.2 01/25/2019   MCV 89.5 01/25/2019   PLT 231 01/25/2019   Lab Results  Component Value Date   CREATININE 0.71 01/25/2019   BUN 18 01/25/2019   NA 141 01/25/2019   K 4.4 01/25/2019   CL 106 01/25/2019   CO2 28 01/25/2019   No results found for: INR, PROTIME  EKG: normal sinus rhythm.  Anesthesia Physical Anesthesia Plan  ASA: II  Anesthesia Plan: General   Post-op Pain Management: GA combined w/ Regional for post-op pain   Induction: Intravenous  PONV Risk Score and Plan: 4 or greater and Ondansetron, Dexamethasone, Midazolam and Scopolamine patch - Pre-op  Airway Management Planned: Oral ETT  Additional Equipment: None  Intra-op Plan:   Post-operative Plan: Extubation in OR  Informed Consent: I have reviewed the patients History and Physical, chart, labs and discussed the procedure including the risks, benefits and alternatives for the proposed anesthesia with the patient or  authorized representative who has indicated his/her understanding and acceptance.     Dental advisory given  Plan Discussed with: CRNA  Anesthesia Plan Comments:        Anesthesia Quick Evaluation

## 2019-01-28 NOTE — Progress Notes (Signed)
PHARMACIST - PHYSICIAN ORDER COMMUNICATION  CONCERNING: P&T Medication Policy on Herbal Medications  DESCRIPTION:  This patient's order for: Co-Q-10 has been noted.  This product(s) is classified as an "herbal" or natural product. Due to a lack of definitive safety studies or FDA approval, nonstandard manufacturing practices, plus the potential risk of unknown drug-drug interactions while on inpatient medications, the Pharmacy and Therapeutics Committee does not permit the use of "herbal" or natural products of this type within [facility].   ACTION TAKEN: The pharmacy department is unable to verify this order at this time and your patient has been informed of this safety policy. Please reevaluate patient's clinical condition at discharge and address if the herbal or natural product(s) should be resumed at that time.   Lindell Spar, PharmD, BCPS Pager: [phone] 01/28/2019 4:38 PM

## 2019-01-28 NOTE — Op Note (Signed)
Procedure Note  Pre-operative Diagnosis:  Thyroid neoplasm of uncertain behavior, multiple thyroid nodules  Post-operative Diagnosis:  same  Surgeon:  Armandina Gemma, MD  Assistant:  none   Procedure:  Total thyroidectomy  Anesthesia:  General  Estimated Blood Loss:  minimal  Drains: none         Specimen: thyroid to pathology  Indications:  Patient is referred by her endocrinologist, Dr. Boyce Medici, for surgical evaluation and management of thyroid neoplasm of uncertain behavior. Patient had first presented to her primary care physician, Dr. Melinda Crutch, in February 2020. She had experienced left neck pain. As part of her evaluation she underwent a thyroid ultrasound on September 21, 2018. This showed bilateral thyroid nodules with a dominant 2.9 cm nodule in the left thyroid lobe which was felt to be mildly suspicious. Subsequent fine-needle aspiration biopsy demonstrated cytologic atypia, Bethesda Category III, and the specimen was submitted for [facility] testing. Results were suspicious, rendering a risk of malignancy of 50%. Patient has a normal TSH level of 1.11. She has no prior history of thyroid disease. She has never been on thyroid medication. She has had no prior head and neck surgery. There is a history of hypothyroidism in the patient's sister and there is a history of throat cancer in the patient's maternal grandmother. An exact diagnosis was not available. Patient has had mild occasional dysphagia. She has required upper endoscopy and esophageal dilatation due to the presence of a ring and hiatal hernia. Patient presents today to discuss possible thyroid surgery for definitive diagnosis.  Procedure Details: Procedure was done in OR #4 at the [facility]. The patient was brought to the operating room and placed in a supine position on the operating room table. Following administration of general anesthesia, the patient was positioned and then prepped and  draped in the usual aseptic fashion. After ascertaining that an adequate level of anesthesia had been achieved, a small Kocher incision was made with #15 blade. Dissection was carried through subcutaneous tissues and platysma.Hemostasis was achieved with the electrocautery. Skin flaps were elevated cephalad and caudad from the thyroid notch to the sternal notch. A Mahorner self-retaining retractor was placed for exposure. Strap muscles were incised in the midline and dissection was begun on the left side.  Strap muscles were reflected laterally.  Left thyroid lobe was quite small with palpable firm nodules.  The left lobe was gently mobilized with blunt dissection. Superior pole vessels were dissected out and divided individually between small and medium ligaclips with the harmonic scalpel. The thyroid lobe was rolled anteriorly. Branches of the inferior thyroid artery were divided between small ligaclips with the harmonic scalpel. Inferior venous tributaries were divided between ligaclips. Both the superior and inferior parathyroid glands were identified and preserved on their vascular pedicles. The recurrent laryngeal nerve was identified and preserved along its course. The ligament of Gwenlyn Found was released with the electrocautery and the gland was mobilized onto the anterior trachea. Isthmus was mobilized across the midline. There was no pyramidal lobe present. Dry pack was placed in the left neck.  The right thyroid lobe was gently mobilized with blunt dissection. Right thyroid lobe was slightly larger than the left with multiple small nodules. Superior pole vessels were dissected out and divided between small and medium ligaclips with the Harmonic scalpel. Superior parathyroid was identified and preserved. Inferior venous tributaries were divided between medium ligaclips with the harmonic scalpel. The right thyroid lobe was rolled anteriorly and the branches of the inferior thyroid artery  divided between small  ligaclips. The right recurrent laryngeal nerve was identified and preserved along its course. The ligament of Gwenlyn Found was released with the electrocautery. The right thyroid lobe was mobilized onto the anterior trachea and the remainder of the thyroid was dissected off the anterior trachea and the thyroid was completely excised. A suture was used to mark the left lobe. The entire thyroid gland was submitted to pathology for review.  The neck was irrigated with warm saline. Fibrillar was placed throughout the operative field. Strap muscles were approximated in the midline with interrupted 3-0 Vicryl sutures. Platysma was closed with interrupted 3-0 Vicryl sutures. Skin was closed with a running 4-0 Monocryl subcuticular suture. Wound was washed and Dermabond was applied. The patient was awakened from anesthesia and brought to the recovery room. The patient tolerated the procedure well.   Armandina Gemma, MD [facility] Office: [phone]

## 2019-01-28 NOTE — Transfer of Care (Signed)
Immediate Anesthesia Transfer of Care Note  Patient: Alison [name]  Procedure(s) Performed: TOTAL THYROIDECTOMY (N/A )  Patient Location: PACU  Anesthesia Type:General  Level of Consciousness: awake, alert  and oriented  Airway & Oxygen Therapy: Patient Spontanous Breathing and Patient connected to face mask oxygen  Post-op Assessment: Report given to RN and Post -op Vital signs reviewed and stable  Post vital signs: Reviewed and stable  Last Vitals:  Vitals Value Taken Time  BP 164/91 01/28/19 1453  Temp    Pulse 87 01/28/19 1454  Resp 15 01/28/19 1454  SpO2 98 % 01/28/19 1454  Vitals shown include unvalidated device data.  Last Pain:  Vitals:   01/28/19 1158  TempSrc:   PainSc: 0-No pain      Patients Stated Pain Goal: 4 (88/67/73 7366)  Complications: No apparent anesthesia complications

## 2019-01-28 NOTE — Interval H&P Note (Signed)
History and Physical Interval Note:  01/28/2019 12:49 PM  Alison [name]  has presented today for surgery, with the diagnosis of THYROID NEOPLASM OF UNCERTAIN BEHAVIOR.  The various methods of treatment have been discussed with the patient and family. After consideration of risks, benefits and other options for treatment, the patient has consented to    Procedure(s): TOTAL THYROIDECTOMY (N/A) as a surgical intervention.    The patient's history has been reviewed, patient examined, no change in status, stable for surgery.  I have reviewed the patient's chart and labs.  Questions were answered to the patient's satisfaction.    Armandina Gemma, [facility] Office: [place]

## 2019-01-28 NOTE — Progress Notes (Signed)
Amlodipine not given after speaking with Dr. Smith Robert. Pt states she only takes her amlodipine-benazepril every other night and she took it last night. Told not to give.

## 2019-01-28 NOTE — Anesthesia Postprocedure Evaluation (Signed)
Anesthesia Post Note  Patient: Alison [name]  Procedure(s) Performed: TOTAL THYROIDECTOMY (N/A )     Patient location during evaluation: PACU Anesthesia Type: General Level of consciousness: awake and alert Pain management: pain level controlled Vital Signs Assessment: post-procedure vital signs reviewed and stable Respiratory status: spontaneous breathing, nonlabored ventilation, respiratory function stable and patient connected to nasal cannula oxygen Cardiovascular status: blood pressure returned to baseline and stable Postop Assessment: no apparent nausea or vomiting Anesthetic complications: no    Last Vitals:  Vitals:   01/28/19 1622 01/28/19 1734  BP: 137/82 133/78  Pulse: 68 64  Resp: 16 16  Temp: (!) 36.3 C 36.7 C  SpO2: 98% 99%    Last Pain:  Vitals:   01/28/19 1734  TempSrc: Oral  PainSc:                  [name]

## 2019-01-29 ENCOUNTER — Encounter (HOSPITAL_COMMUNITY): Payer: Self-pay | Admitting: Surgery

## 2019-01-29 DIAGNOSIS — C73 Malignant neoplasm of thyroid gland: Secondary | ICD-10-CM | POA: Diagnosis not present

## 2019-01-29 LAB — BASIC METABOLIC PANEL
Anion gap: 7 (ref 5–15)
BUN: 14 mg/dL (ref 8–23)
CO2: 25 mmol/L (ref 22–32)
Calcium: 8 mg/dL — ABNORMAL LOW (ref 8.9–10.3)
Chloride: 102 mmol/L (ref 98–111)
Creatinine, Ser: 0.67 mg/dL (ref 0.44–1.00)
GFR calc Af Amer: >60 mL/min (ref >60)
GFR calc non Af Amer: >60 mL/min (ref >60)
Glucose, Bld: 180 mg/dL — ABNORMAL HIGH (ref 70–99)
Potassium: 3.9 mmol/L (ref 3.5–5.1)
Sodium: 134 mmol/L — ABNORMAL LOW (ref 135–145)

## 2019-01-29 MED ORDER — CALCIUM GLUCONATE-NACL 2-0.675 GM/100ML-% IV SOLN
2.0000 g | INTRAVENOUS | Status: AC
  Administered 2019-01-29: 2000 mg via INTRAVENOUS
  Filled 2019-01-29: qty 100

## 2019-01-29 MED ORDER — CALCIUM CARBONATE 1250 (500 CA) MG PO TABS
2.0000 | ORAL_TABLET | ORAL | Status: AC
  Administered 2019-01-29: 1000 mg via ORAL
  Filled 2019-01-29: qty 1

## 2019-01-29 NOTE — Plan of Care (Signed)
  Problem: Education: Goal: Knowledge of the prescribed therapeutic regimen will improve Outcome: Progressing   Problem: Clinical Measurements: Goal: Complications related to the disease process, condition or treatment will be avoided or minimized Outcome: Progressing   Problem: Respiratory: Goal: Ability to maintain a clear airway will improve Outcome: Progressing   

## 2019-01-29 NOTE — Discharge Summary (Signed)
Physician Discharge Summary [facility]  Patient ID: Alison [name] MRN: [id number] DOB/AGE: 1951-07-30 68 y.o.  Admit date: 01/28/2019 Discharge date: 01/29/2019  Admission Diagnoses:  Thyroid neoplasm of uncertain behavior  Discharge Diagnoses:  Principal Problem:   Neoplasm of uncertain behavior of thyroid gland   Discharged Condition: good  Hospital Course: Patient was admitted for observation following thyroid surgery.  Post op course was uncomplicated.  Pain was well controlled.  Tolerated diet.  Post op calcium level on morning following surgery was 8.0 mg/dl.  IV calcium gluconate 2 gm was administered prior to discharge.  Patient was prepared for discharge home on POD#1.  Consults: None  Treatments: surgery: total thyroidectomy  Discharge Exam: Blood pressure 123/61, pulse 64, temperature 97.9 F (36.6 C), temperature source Oral, resp. rate 16, height 5\' 1"  (1.549 m), weight 63.2 kg, SpO2 99 %. HEENT - clear Neck - wound dry and intact; Dermabond in place; voice normal Chest - clear bilaterally Cor - RRR  Disposition: Home  Discharge Instructions    Diet - low sodium heart healthy   Complete by: As directed    Discharge instructions   Complete by: As directed    [facility]  THYROID & PARATHYROID SURGERY:  POST-OP INSTRUCTIONS  Always review your discharge instruction sheet from the facility where your surgery was performed.  A prescription for pain medication may be given to you upon discharge.  Take your pain medication as prescribed.  If narcotic pain medicine is not needed, then you may take acetaminophen (Tylenol) or ibuprofen (Advil) as needed.  Take your usually prescribed medications unless otherwise directed.  If you need a refill on your pain medication, please contact our office during regular business hours.  Prescriptions cannot be processed by our office after 5 pm or on weekends.  Start with a light  diet upon arrival home, such as soup and crackers or toast.  Be sure to drink plenty of fluids daily.  Resume your normal diet the day after surgery.  Most patients will experience some swelling and bruising on the chest and neck area.  Ice packs will help.  Swelling and bruising can take several days to resolve.   It is common to experience some constipation after surgery.  Increasing fluid intake and taking a stool softener (Colace) will usually help or prevent this problem.  A mild laxative (Milk of Magnesia or Miralax) should be taken according to package directions if there has been no bowel movement after 48 hours.  You have steri-strips and a gauze dressing over your incision.  You may remove the gauze bandage on the second day after surgery, and you may shower at that time.  Leave your steri-strips (small skin tapes) in place directly over the incision.  These strips should remain on the skin for 5-7 days and then be removed.  You may get them wet in the shower and pat them dry.  You may resume regular (light) daily activities beginning the next day (such as daily self-care, walking, climbing stairs) gradually increasing activities as tolerated.  You may have sexual intercourse when it is comfortable.  Refrain from any heavy lifting or straining until approved by your doctor.  You may drive when you no longer are taking prescription pain medication, you can comfortably wear a seatbelt, and you can safely maneuver your car and apply brakes.  You should see your doctor in the office for a follow-up appointment approximately three weeks after  your surgery.  Make sure that you call for this appointment within a day or two after you arrive home to insure a convenient appointment time.  WHEN TO CALL YOUR DOCTOR: -- Fever greater than 101.5 -- Inability to urinate -- Nausea and/or vomiting - persistent -- Extreme swelling or bruising -- Continued bleeding from incision -- Increased pain, redness,  or drainage from the incision -- Difficulty swallowing or breathing -- Muscle cramping or spasms -- Numbness or tingling in hands or around lips  The clinic staff is available to answer your questions during regular business hours.  Please don't hesitate to call and ask to speak to one of the nurses if you have concerns.  Armandina Gemma, MD [facility] Office: [phone]   Increase activity slowly   Complete by: As directed    No dressing needed   Complete by: As directed      Allergies as of 01/29/2019      Reactions   Shellfish Allergy Anaphylaxis, Hives   Betadine [povidone Iodine] Other (See Comments)   Unspecified reaction to topical betadine used for skin prep.    Penicillins Hives, Swelling   Did it involve swelling of the face/tongue/throat, SOB, or low BP? Unknown Did it involve sudden or severe rash/hives, skin peeling, or any reaction on the inside of your mouth or nose? Unknown Did you need to seek medical attention at a hospital or doctor's office? Unknown When did it last happen?childhood allergy If all above answers are "NO", may proceed with cephalosporin use.   Tetracycline Hcl Hives   Tramadol Nausea And Vomiting      Medication List    TAKE these medications   amLODipine-benazepril 5-10 MG capsule Commonly known as: LOTREL Take 1 capsule by mouth every other day.   aspirin EC 81 MG tablet Take 81 mg by mouth daily.   Biotin 1000 MCG tablet Take 1,000 mcg by mouth every other day.   Co Q-10 200 MG Caps Take 200 mg by mouth daily.   HYDROcodone-acetaminophen 5-325 MG tablet Commonly known as: NORCO/VICODIN Take 1 tablet by mouth every 6 (six) hours as needed.   HYDROcodone-acetaminophen 5-325 MG tablet Commonly known as: NORCO/VICODIN Take 1 tablet by mouth every 6 (six) hours as needed.   hydrocortisone 2.5 % rectal cream Commonly known as: Anusol-HC Apply rectally 2 times daily   hydrocortisone 2.5 % rectal cream  Commonly known as: Anusol-HC Apply rectally 2 times daily   omeprazole 20 MG capsule Commonly known as: PRILOSEC Take 20 mg by mouth every 3 (three) days.   ONE-A-DAY WOMENS 50 PLUS PO Take 1 tablet by mouth daily.   Prolia 60 MG/ML Sosy injection Generic drug: denosumab Inject 60 mg into the muscle every 6 (six) months.   simvastatin 20 MG tablet Commonly known as: ZOCOR Take 20 mg by mouth daily.   sucralfate 1 GM/10ML suspension Commonly known as: Carafate Take 10 mLs (1 g total) by mouth 4 (four) times daily -  with meals and at bedtime.   VITAMIN B12 PO Take 1,000 mcg by mouth every other day.   Vitamin D-3 25 MCG (1000 UT) Caps Take 1,000 Units by mouth daily.   Wellbutrin XL 300 MG 24 hr tablet Generic drug: buPROPion Take 300 mg by mouth daily.      Follow-up Information    Armandina Gemma, MD. Schedule an appointment as soon as possible for a visit in 3 week(s).   Specialty: General Surgery Contact information: [address] [place] [address] [phone]           Charonda Hefter M. Dartanian Knaggs, MD, [facility] Office: [phone]   Signed: Armandina Gemma 01/29/2019, 12:27 PM

## 2019-01-29 NOTE — Plan of Care (Signed)
  Problem: Education: Goal: Knowledge of the prescribed therapeutic regimen will improve 01/29/2019 1320 by Hubert Azure, RN Outcome: Adequate for Discharge 01/29/2019 1038 by Hubert Azure, RN Outcome: Progressing   Problem: Clinical Measurements: Goal: Complications related to the disease process, condition or treatment will be avoided or minimized 01/29/2019 1320 by Hubert Azure, RN Outcome: Adequate for Discharge 01/29/2019 1038 by Hubert Azure, RN Outcome: Progressing   Problem: Respiratory: Goal: Ability to maintain a clear airway will improve 01/29/2019 1320 by Hubert Azure, RN Outcome: Adequate for Discharge 01/29/2019 1038 by Hubert Azure, RN Outcome: Progressing

## 2019-02-15 DIAGNOSIS — E042 Nontoxic multinodular goiter: Secondary | ICD-10-CM | POA: Diagnosis not present

## 2019-02-15 DIAGNOSIS — D440 Neoplasm of uncertain behavior of thyroid gland: Secondary | ICD-10-CM | POA: Diagnosis not present

## 2019-03-22 DIAGNOSIS — E041 Nontoxic single thyroid nodule: Secondary | ICD-10-CM | POA: Diagnosis not present

## 2019-03-31 DIAGNOSIS — M810 Age-related osteoporosis without current pathological fracture: Secondary | ICD-10-CM | POA: Diagnosis not present

## 2019-03-31 DIAGNOSIS — L649 Androgenic alopecia, unspecified: Secondary | ICD-10-CM | POA: Diagnosis not present

## 2019-03-31 DIAGNOSIS — R7303 Prediabetes: Secondary | ICD-10-CM | POA: Diagnosis not present

## 2019-03-31 DIAGNOSIS — E890 Postprocedural hypothyroidism: Secondary | ICD-10-CM | POA: Diagnosis not present

## 2019-03-31 DIAGNOSIS — Z6827 Body mass index (BMI) 27.0-27.9, adult: Secondary | ICD-10-CM | POA: Diagnosis not present

## 2019-03-31 DIAGNOSIS — Z7189 Other specified counseling: Secondary | ICD-10-CM | POA: Diagnosis not present

## 2019-03-31 DIAGNOSIS — C73 Malignant neoplasm of thyroid gland: Secondary | ICD-10-CM | POA: Diagnosis not present

## 2019-05-10 DIAGNOSIS — E782 Mixed hyperlipidemia: Secondary | ICD-10-CM | POA: Diagnosis not present

## 2019-05-10 DIAGNOSIS — I10 Essential (primary) hypertension: Secondary | ICD-10-CM | POA: Diagnosis not present

## 2019-05-10 DIAGNOSIS — M810 Age-related osteoporosis without current pathological fracture: Secondary | ICD-10-CM | POA: Diagnosis not present

## 2019-05-31 DIAGNOSIS — E890 Postprocedural hypothyroidism: Secondary | ICD-10-CM | POA: Diagnosis not present

## 2019-06-01 DIAGNOSIS — Z23 Encounter for immunization: Secondary | ICD-10-CM | POA: Diagnosis not present

## 2019-06-01 DIAGNOSIS — Z20828 Contact with and (suspected) exposure to other viral communicable diseases: Secondary | ICD-10-CM | POA: Diagnosis not present

## 2019-06-02 DIAGNOSIS — Z6827 Body mass index (BMI) 27.0-27.9, adult: Secondary | ICD-10-CM | POA: Diagnosis not present

## 2019-06-02 DIAGNOSIS — E890 Postprocedural hypothyroidism: Secondary | ICD-10-CM | POA: Diagnosis not present

## 2019-06-02 DIAGNOSIS — Z7189 Other specified counseling: Secondary | ICD-10-CM | POA: Diagnosis not present

## 2019-06-02 DIAGNOSIS — R7303 Prediabetes: Secondary | ICD-10-CM | POA: Diagnosis not present

## 2019-06-02 DIAGNOSIS — M810 Age-related osteoporosis without current pathological fracture: Secondary | ICD-10-CM | POA: Diagnosis not present

## 2019-06-02 DIAGNOSIS — L649 Androgenic alopecia, unspecified: Secondary | ICD-10-CM | POA: Diagnosis not present

## 2019-06-02 DIAGNOSIS — C73 Malignant neoplasm of thyroid gland: Secondary | ICD-10-CM | POA: Diagnosis not present

## 2019-06-09 DIAGNOSIS — I10 Essential (primary) hypertension: Secondary | ICD-10-CM | POA: Diagnosis not present

## 2019-06-09 DIAGNOSIS — M810 Age-related osteoporosis without current pathological fracture: Secondary | ICD-10-CM | POA: Diagnosis not present

## 2019-06-09 DIAGNOSIS — E782 Mixed hyperlipidemia: Secondary | ICD-10-CM | POA: Diagnosis not present

## 2019-07-12 DIAGNOSIS — E782 Mixed hyperlipidemia: Secondary | ICD-10-CM | POA: Diagnosis not present

## 2019-07-12 DIAGNOSIS — M810 Age-related osteoporosis without current pathological fracture: Secondary | ICD-10-CM | POA: Diagnosis not present

## 2019-07-12 DIAGNOSIS — I10 Essential (primary) hypertension: Secondary | ICD-10-CM | POA: Diagnosis not present

## 2019-08-12 DIAGNOSIS — I10 Essential (primary) hypertension: Secondary | ICD-10-CM | POA: Diagnosis not present

## 2019-08-12 DIAGNOSIS — E782 Mixed hyperlipidemia: Secondary | ICD-10-CM | POA: Diagnosis not present

## 2019-08-12 DIAGNOSIS — M810 Age-related osteoporosis without current pathological fracture: Secondary | ICD-10-CM | POA: Diagnosis not present

## 2019-09-09 DIAGNOSIS — E782 Mixed hyperlipidemia: Secondary | ICD-10-CM | POA: Diagnosis not present

## 2019-09-09 DIAGNOSIS — I10 Essential (primary) hypertension: Secondary | ICD-10-CM | POA: Diagnosis not present

## 2019-09-09 DIAGNOSIS — M810 Age-related osteoporosis without current pathological fracture: Secondary | ICD-10-CM | POA: Diagnosis not present

## 2019-09-29 DIAGNOSIS — L649 Androgenic alopecia, unspecified: Secondary | ICD-10-CM | POA: Diagnosis not present

## 2019-09-29 DIAGNOSIS — C73 Malignant neoplasm of thyroid gland: Secondary | ICD-10-CM | POA: Diagnosis not present

## 2019-09-29 DIAGNOSIS — E890 Postprocedural hypothyroidism: Secondary | ICD-10-CM | POA: Diagnosis not present

## 2019-09-29 DIAGNOSIS — M810 Age-related osteoporosis without current pathological fracture: Secondary | ICD-10-CM | POA: Diagnosis not present

## 2019-10-06 DIAGNOSIS — C73 Malignant neoplasm of thyroid gland: Secondary | ICD-10-CM | POA: Diagnosis not present

## 2019-10-06 DIAGNOSIS — L649 Androgenic alopecia, unspecified: Secondary | ICD-10-CM | POA: Diagnosis not present

## 2019-10-06 DIAGNOSIS — M810 Age-related osteoporosis without current pathological fracture: Secondary | ICD-10-CM | POA: Diagnosis not present

## 2019-10-06 DIAGNOSIS — Z6827 Body mass index (BMI) 27.0-27.9, adult: Secondary | ICD-10-CM | POA: Diagnosis not present

## 2019-10-06 DIAGNOSIS — R7303 Prediabetes: Secondary | ICD-10-CM | POA: Diagnosis not present

## 2019-10-06 DIAGNOSIS — E890 Postprocedural hypothyroidism: Secondary | ICD-10-CM | POA: Diagnosis not present

## 2019-10-06 DIAGNOSIS — Z7189 Other specified counseling: Secondary | ICD-10-CM | POA: Diagnosis not present

## 2019-10-13 DIAGNOSIS — Z124 Encounter for screening for malignant neoplasm of cervix: Secondary | ICD-10-CM | POA: Diagnosis not present

## 2019-10-13 DIAGNOSIS — M810 Age-related osteoporosis without current pathological fracture: Secondary | ICD-10-CM | POA: Diagnosis not present

## 2019-10-14 ENCOUNTER — Other Ambulatory Visit: Payer: Self-pay | Admitting: Obstetrics & Gynecology

## 2019-10-14 DIAGNOSIS — M810 Age-related osteoporosis without current pathological fracture: Secondary | ICD-10-CM

## 2019-10-19 ENCOUNTER — Ambulatory Visit
Admission: RE | Admit: 2019-10-19 | Discharge: 2019-10-19 | Disposition: A | Discharge status: Home / Self Care | Payer: PPO | Source: Ambulatory Visit | Attending: Obstetrics & Gynecology | Admitting: Obstetrics & Gynecology

## 2019-10-19 ENCOUNTER — Other Ambulatory Visit: Payer: Self-pay

## 2019-10-19 DIAGNOSIS — Z780 Asymptomatic menopausal state: Secondary | ICD-10-CM | POA: Diagnosis not present

## 2019-10-19 DIAGNOSIS — M810 Age-related osteoporosis without current pathological fracture: Secondary | ICD-10-CM | POA: Diagnosis not present

## 2019-10-19 DIAGNOSIS — M8588 Other specified disorders of bone density and structure, other site: Secondary | ICD-10-CM | POA: Diagnosis not present

## 2019-10-25 DIAGNOSIS — Z1231 Encounter for screening mammogram for malignant neoplasm of breast: Secondary | ICD-10-CM | POA: Diagnosis not present

## 2019-11-02 ENCOUNTER — Encounter: Payer: Self-pay | Admitting: Neurology

## 2019-11-02 ENCOUNTER — Other Ambulatory Visit: Payer: Self-pay

## 2019-11-02 ENCOUNTER — Ambulatory Visit: Payer: PPO | Admitting: Neurology

## 2019-11-02 VITALS — BP 115/74 | HR 80 | Temp 97.4°F | Ht 61.0 in | Wt 142.0 lb

## 2019-11-02 DIAGNOSIS — R0683 Snoring: Secondary | ICD-10-CM

## 2019-11-02 DIAGNOSIS — I773 Arterial fibromuscular dysplasia: Secondary | ICD-10-CM | POA: Diagnosis not present

## 2019-11-02 DIAGNOSIS — G4733 Obstructive sleep apnea (adult) (pediatric): Secondary | ICD-10-CM

## 2019-11-02 DIAGNOSIS — I10 Essential (primary) hypertension: Secondary | ICD-10-CM

## 2019-11-02 DIAGNOSIS — I209 Angina pectoris, unspecified: Secondary | ICD-10-CM | POA: Diagnosis not present

## 2019-11-02 NOTE — Progress Notes (Signed)
SLEEP MEDICINE CLINIC    Provider:  Larey Seat, MD  Primary Care Physician:  Lawerance Cruel, MD [place] [address]     Referring Provider: Lawerance Cruel, [place],  [place] [address]          Chief Complaint according to patient   Patient presents with:    . New Patient (Initial Visit)     pt alone, rm 10. pt states that her orthrodontist questions that she has a restricted airway. There was also a spot that was found on the right side of the brain that he mentioned could be of concern. never had SS. pt snores intermittenly. she has had snoring wake her up but unsure of apnea. she has been having trouble sleeping.       HISTORY OF PRESENT ILLNESS:  Alison [name] is a 69  year old Caucasian  female patient seen here upon Dentist Dr. Doy Mince referral on 11/02/2019 . Chief concern according to patient :  I am waking myself snoring,  Alison [name] had seen her dentist who performed a panoramic x-ray of her oral cavity and found that she had a very narrow airway as documented there.  He was suspicious that she may have possible sleep apnea, she also has a history of acid reflux she had already undergone esophageal stretching procedures.  However her upper airway had not been evaluated or treated in any way.  It is possible that she has laryngeal pharyngeal reflux causing swelling of the epiglottis and soft palate.  Supportively, she has noted that when she sleeps supine she wakes much more likely up from snoring and may be gasping.  She denies coughing in the middle of the night and usually does not have a bad taste in her mouth when she wakes up.   I have the pleasure of seeing Alison [name] , a right-handed Caucasian female with a possible sleep disorder.  She  has a past medical history of Arthritis, Chest pain, Depression, Fibromuscular dysplasia ([place]), GERD (gastroesophageal reflux disease), Hypercholesteremia,  Hypertension, Hyperthyroidism, and Osteoporosis.Bruxism.     Sleep relevant medical history: always feeling tired-  HTN, no nocturia, Sleep walking in childhood until mid- twenties. Esophageal reflux, stretching procedure.    Family medical /sleep history: Mother died at age 17 of GBM- no other family member on CPAP with OSA, insomnia, nor sleep walkers.    Social history: Patient is retired from [employer]-  and lives in a household with spouse , they have adult children- Pets are present. One dog.  Tobacco use- never .  ETOH use ; seldomly. Caffeine intake in form of Coffee( none ) Soda( none ) Tea ( none) and no energy drinks. Due to GERD> Regular exercise in form of walking every day. hobby - horses.   Sleep habits are as follows: The patient's dinner time is between 6.30 PM. The patient goes to bed at 11 PM and continues to sleep for several hours, wakes rarely for bathroom breaks, the first time at 3-4 AM.   The preferred sleep position is on her side, with the support of 1 pillow- COOP, dense- helps neck pain. Dreams are reportedly present/vivid.  7.30 AM is the usual rise time. The patient wakes up spontaneously.  She reports not feeling refreshed or restored in AM, with symptoms such as dry mouth, morning headaches( heavy head ) , and residual fatigue.  Naps are taken infrequently, lasting  from 15 minutes and are more refreshing than nocturnal sleep.    Review of Systems: Out of a complete 14 system review, the patient complains of only the following symptoms, and all other reviewed systems are negative. Depression during the pandemic.   Fatigue ( high ),snoring, sometimes fragmented sleep, Insomnia rarely, SAD seasonal depressive disorder.    How likely are you to doze in the following situations: 0 = not likely, 1 = slight chance, 2 = moderate chance, 3 = high chance   Sitting and Reading? Watching Television? Sitting inactive in a public place (theater or meeting)? As a  passenger in a car for an hour without a break? Lying down in the afternoon when circumstances permit? Sitting and talking to someone? Sitting quietly after lunch without alcohol? In a car, while stopped for a few minutes in traffic?   Total =5/ 24 points   FSS endorsed at 52 / 63 points.   Worsening since thyroid tumor removed, L thyroxine 112, but feeling not well- TSH is in normal range.    Social History   Socioeconomic History  . Marital status: Married    Spouse name: Not on file  . Number of children: 2  . Years of education: Not on file  . Highest education level: Not on file  Occupational History  . Occupation: Retired    Fish farm manager: [employer]  Tobacco Use  . Smoking status: Never Smoker  . Smokeless tobacco: Never Used  Substance and Sexual Activity  . Alcohol use: Yes    Comment: social  . Drug use: No  . Sexual activity: Yes    Birth control/protection: None  Other Topics Concern  . Not on file  Social History Narrative  . Not on file   Social Determinants of Health   Financial Resource Strain:   . Difficulty of Paying Living Expenses:   Food Insecurity:   . Worried About Charity fundraiser in the Last Year:   . Arboriculturist in the Last Year:   Transportation Needs:   . Film/video editor (Medical):   Marland Kitchen Lack of Transportation (Non-Medical):   Physical Activity:   . Days of Exercise per Week:   . Minutes of Exercise per Session:   Stress:   . Feeling of Stress :   Social Connections:   . Frequency of Communication with Friends and Family:   . Frequency of Social Gatherings with Friends and Family:   . Attends Religious Services:   . Active Member of Clubs or Organizations:   . Attends Archivist Meetings:   Marland Kitchen Marital Status:     Family History  Problem Relation Age of Onset  . Hyperlipidemia Mother   . Brain cancer Mother   . Heart disease Father        Vavle replacement   . Hyperlipidemia Father   . Diabetes Father   .  Peripheral vascular disease Father   . Atrial fibrillation Father   . Osteoarthritis Paternal Aunt   . Lupus Paternal Uncle   . Hypothyroidism Sister   . Anesthesia problems Neg Hx   . Hypotension Neg Hx   . Malignant hyperthermia Neg Hx   . Pseudochol deficiency Neg Hx     Past Medical History:  Diagnosis Date  . Arthritis   . Chest pain   . Depression   . Fibromuscular dysplasia ([place])   . GERD (gastroesophageal reflux disease)   . Hypercholesteremia   . Hypertension    on meds  .  Hyperthyroidism   . Osteoporosis     Past Surgical History:  Procedure Laterality Date  . ABDOMINAL HYSTERECTOMY    . APPENDECTOMY     1969  . CARDIAC CATHETERIZATION     normal cath , chest pains due to stress  . CESAREAN SECTION  1985  . colonscopy  2009  . LUMBAR LAMINECTOMY/DECOMPRESSION MICRODISCECTOMY  12/23/2011   Procedure: LUMBAR LAMINECTOMY/DECOMPRESSION MICRODISCECTOMY 1 LEVEL;  Surgeon: Hosie Spangle, MD;  Location: [facility];  Service: Neurosurgery;  Laterality: Right;  RIGHT Lumbar two- three laminotomy and microdiskectomy  . ORIF WRIST FRACTURE  2006  . THYROIDECTOMY N/A 01/28/2019   Procedure: TOTAL THYROIDECTOMY;  Surgeon: Armandina Gemma, MD;  Location: [facility];  Service: General;  Laterality: N/A;  . UPPER GI ENDOSCOPY       Current Outpatient Medications on File Prior to Visit  Medication Sig Dispense Refill  . amLODipine-benazepril (LOTREL) 5-10 MG per capsule Take 1 capsule by mouth every other day.     Marland Kitchen aspirin EC 81 MG tablet Take 81 mg by mouth daily.    . Biotin 1000 MCG tablet Take 1,000 mcg by mouth every other day.     Marland Kitchen buPROPion (WELLBUTRIN XL) 300 MG 24 hr tablet Take 300 mg by mouth daily.    . Cholecalciferol (VITAMIN D-3) 25 MCG (1000 UT) CAPS Take 1,000 Units by mouth daily.     . Coenzyme Q10 (CO Q-10) 200 MG CAPS Take 200 mg by mouth daily.     . Cyanocobalamin (VITAMIN B12 PO) Take 1,000 mcg by mouth every other day.     . hydrocortisone  (ANUSOL-HC) 2.5 % rectal cream Apply rectally 2 times daily 28.35 g 0  . levothyroxine (SYNTHROID) 112 MCG tablet TAKE ONE TABLET BY MOUTH EVERY MORNING ON A.N EMPTY STOMACH EXCEPT TAKE ONE-HALF TABLET ON SUNDAY    . Multiple Vitamins-Minerals (ONE-A-DAY WOMENS 50 PLUS PO) Take 1 tablet by mouth daily.    Marland Kitchen omeprazole (PRILOSEC) 20 MG capsule Take 20 mg by mouth every 3 (three) days.     Marland Kitchen PROLIA 60 MG/ML SOSY injection Inject 60 mg into the muscle every 6 (six) months.  1  . simvastatin (ZOCOR) 20 MG tablet Take 20 mg by mouth daily.     No current facility-administered medications on file prior to visit.    Allergies  Allergen Reactions  . Shellfish Allergy Anaphylaxis and Hives  . Betadine [Povidone Iodine] Other (See Comments)    Unspecified reaction to topical betadine used for skin prep.   Marland Kitchen Penicillins Hives and Swelling    Did it involve swelling of the face/tongue/throat, SOB, or low BP? Unknown Did it involve sudden or severe rash/hives, skin peeling, or any reaction on the inside of your mouth or nose? Unknown Did you need to seek medical attention at a hospital or doctor's office? Unknown When did it last happen?childhood allergy If all above answers are "NO", may proceed with cephalosporin use.   . Tetracycline Hcl Hives  . Tramadol Nausea And Vomiting    Physical exam:  Today's Vitals   11/02/19 1007  BP: 115/74  Pulse: 80  Temp: (!) 97.4 F (36.3 C)  Weight: 142 lb (64.4 kg)  Height: 5\' 1"  (1.549 m)   Body mass index is 26.83 kg/m.   Wt Readings from Last 3 Encounters:  11/02/19 142 lb (64.4 kg)  01/28/19 139 lb 4 oz (63.2 kg)  01/25/19 139 lb 4 oz (63.2 kg)     Ht  Readings from Last 3 Encounters:  11/02/19 5\' 1"  (1.549 m)  01/28/19 5\' 1"  (1.549 m)  01/25/19 5\' 1"  (1.549 m)      General: The patient is awake, alert and appears not in acute distress. The patient is well groomed. Head: Normocephalic, atraumatic. Neck is supple. Mallampati: 3  plus. neck circumference:14 inches.  Nasal airflow patent. Retrognathia is not seen.  Dental status:   Cardiovascular:  Regular rate and cardiac rhythm by pulse,  without distended neck veins. Respiratory: Lungs are clear to auscultation.  Skin:  Without evidence of ankle edema. Trunk: The patient's posture is erect.   Neurologic exam : The patient is awake and alert, oriented to place and time.   Memory subjective described as intact.  Attention span & concentration ability appears normal.  Speech is fluent,  without  dysarthria, dysphonia or aphasia.  Mood and affect are appropriate.   Cranial nerves: no loss of smell or taste reported  Pupils are equal and briskly reactive to light. Funduscopic exam deferred.   Extraocular movements in vertical and horizontal planes were intact and without nystagmus. No Diplopia. Visual fields by finger perimetry are intact. Hearing was intact to soft voice and finger rubbing.    Facial sensation intact to fine touch.  Facial motor strength is symmetric and tongue and uvula move midline.  Neck ROM : rotation, tilt and flexion extension were normal for age and shoulder shrug was symmetrical.    Motor exam:  Symmetric bulk, tone and ROM.   Normal tone without cog wheeling, symmetric grip strength.   Sensory:  Fine touch, pinprick and vibration were tested  and  normal.  Proprioception tested in the upper extremities was normal.   Coordination: Rapid alternating movements in the fingers/hands were of normal speed.  The Finger-to-nose maneuver was intact without evidence of ataxia, dysmetria or tremor.   Gait and station: Patient could rise unassisted from a seated position, walked without assistive device ( RN report) .  Stance is of normal width/ base .  Toe and heel walk were deferred.  Deep tendon reflexes: in the upper and lower extremities are symmetric and intact.  Babinski response was deferred.      After spending a total time of 40  minutes face to face and additional time for physical and neurologic examination, review of laboratory studies,  personal review of imaging studies, reports and results of other testing and review of referral information / records as far as provided in visit, I have established the following assessments:  1) she is fatigued and has some early morning awakenings- known to have SAD- I think this is a reactive depression.   2)  Snoring and anatomical restricted airways- high risk of OSA    My Plan is to proceed with:  1) light box for seasonal affective disorder 2) set rise time. 3) HST to check for apnea.   I would like to thank Dr DDS Tyron Russell for allowing me to meet with and to take care of this pleasant patient.   In short, Alison [name] is presenting with fatigue and a short, narrow airway and snoring.   I plan to follow up either personally or through our NP within 2-3 month.    Electronically signed by: Larey Seat, MD 11/02/2019 10:27 AM  [facility] Board certified by The [employer] of Sleep Medicine and Diplomate of the [employer] of Sleep Medicine. Board certified In Neurology through the [place], Fellow of the  American Academy of Neurology. Medical Director of [facility].

## 2019-11-05 DIAGNOSIS — M810 Age-related osteoporosis without current pathological fracture: Secondary | ICD-10-CM | POA: Diagnosis not present

## 2019-11-09 DIAGNOSIS — H2513 Age-related nuclear cataract, bilateral: Secondary | ICD-10-CM | POA: Diagnosis not present

## 2019-11-09 DIAGNOSIS — H524 Presbyopia: Secondary | ICD-10-CM | POA: Diagnosis not present

## 2019-11-11 DIAGNOSIS — M810 Age-related osteoporosis without current pathological fracture: Secondary | ICD-10-CM | POA: Diagnosis not present

## 2019-11-15 ENCOUNTER — Ambulatory Visit (INDEPENDENT_AMBULATORY_CARE_PROVIDER_SITE_OTHER): Payer: PPO | Admitting: Neurology

## 2019-11-15 DIAGNOSIS — G4733 Obstructive sleep apnea (adult) (pediatric): Secondary | ICD-10-CM

## 2019-11-15 DIAGNOSIS — R0683 Snoring: Secondary | ICD-10-CM

## 2019-11-15 DIAGNOSIS — I773 Arterial fibromuscular dysplasia: Secondary | ICD-10-CM

## 2019-11-15 DIAGNOSIS — I10 Essential (primary) hypertension: Secondary | ICD-10-CM

## 2019-11-15 DIAGNOSIS — I209 Angina pectoris, unspecified: Secondary | ICD-10-CM

## 2019-11-16 DIAGNOSIS — L7211 Pilar cyst: Secondary | ICD-10-CM | POA: Diagnosis not present

## 2019-11-25 DIAGNOSIS — G4733 Obstructive sleep apnea (adult) (pediatric): Secondary | ICD-10-CM | POA: Insufficient documentation

## 2019-11-25 DIAGNOSIS — R0683 Snoring: Secondary | ICD-10-CM | POA: Insufficient documentation

## 2019-11-25 NOTE — Procedures (Signed)
Patient Information     First Name: Alison Last Name: [name] ID: [id number]  Birth Date: December 08, 1950 Age: 69 Gender: Female  Referring Provider: DDS , Dr Doy Mince- and :Lawerance Cruel, MD BMI: 26.6 (W=141 lb, H=5' 1'')  Neck Circ.:  14 '' Epworth/ FSS  5/24- 52/63 points   Sleep Study Information    Study Date: Nov 15, 2019 S/H/A Version: 001.001.001.001 / 4.1.1528 / 42  History:    [name] is a right-handed Caucasian female with a possible sleep disorder.  She presented upon referral by her orthodontist, who found her small mouth opening, suspecting she may have OSA. She reports not sleepiness but a high degree of fatigue. There is a medical history of Arthritis, Chest pain, Depression, thyroid nodule removed - Fibromuscular dysplasia ([place]), GERD (gastroesophageal reflux disease), Hypercholesteremia, Hypertension, Hyperthyroidism, and Osteoporosis- also Bruxism.        Summary & Diagnosis:    Moderate to severe Obstructive Sleep Apnea with an AHI of 30.7/h and REM sleep exacerbation to AHI 49.7/h. mild sleep hypoxia was recorded, and normal heart rate variability was found. Loud snoring is suggested by RDI.  Supine sleep caused the AHI to rise significantly and should be avoided.    Recommendations:       This is REM sleep dependent apnea and at a high degree- the best treatment is CPAP - Positive Airway pressure therapy. A dental device would not overcome the thoracic weakness and lack of air exchange in REM sleep.  Settings of an autotitration capable CPAP from 5-18 cm water, 3 cm EPR and heated humidity with a mask of patient's choice and comfort.  Interpreting Physician: Larey Seat, MD           Sleep Summary  Oxygen Saturation Statistics   Start Study Time: End Study Time: Total Recording Time:          11:51:38 PM 7:24:31 AM 7 h, 32 min  Total Sleep Time % REM of Sleep Time:  6 h, 45 min  25.4    Mean: 93 Minimum: 74 Maximum: 99  Mean of  Desaturations Nadirs (%):   89  Oxygen Desaturation. %:   4-9 10-20 >20 Total  Events Number Total   100  21 82.6 17.4  0 0.0  121 100.0  Oxygen Saturation: <90 <=88 <85 <80 <70  Duration (minutes): Sleep % 11.7 2.9  7.9 1.9  1.9 0.5 0.3 0.1 0.0 0.0     Respiratory Indices      Total Events REM NREM All Night  pRDI:  216  pAHI:  207 ODI:  121  pAHIc:  12  % CSR: 0.0 53.3 49.7 39.8 4.7 24.8 24.2 10.5 0.8 32.0 30.7 17.9 1.8       Pulse Rate Statistics during Sleep (BPM)      Mean:  69 Minimum: 54 Maximum: 92    Indices are calculated using technically valid sleep time of 6 h, 44 min. pRDI/pAHI are calculated using oxi desaturations ? 3%  Body Position Statistics  Position Supine Prone Right Left Non-Supine  Sleep (min) 222.9 0.0 95.0 87.5 182.5  Sleep % 55.0 0.0 23.4 21.6 45.0  pRDI 48.0 N/A 7.6 17.9 12.5  pAHI 47.5 N/A 6.3 14.4 10.2  ODI 29.1 N/A 3.8 4.8 4.3     Snoring Statistics Snoring Level (dB) >40 >50 >60 >70 >80 >Threshold (45)  Sleep (min) 50.4 14.3 2.5 0.0 0.0 32.0  Sleep % 12.4 3.5 0.6 0.0 0.0 7.9  Mean: 41 dB

## 2019-11-25 NOTE — Progress Notes (Signed)
Summary & Diagnosis:   Moderate to severe Obstructive Sleep Apnea with an AHI of 30.7/h  and REM sleep exacerbation to AHI 49.7/h. mild sleep hypoxia was  recorded, and normal heart rate variability was found. Loud  snoring is suggested by RDI.  Supine sleep caused the AHI to rise significantly and should be  avoided.    Recommendations:      This is REM sleep dependent apnea and at a high degree- the best  treatment is CPAP - Positive Airway pressure therapy. A dental  device would not overcome the thoracic weakness and lack of air  exchange in REM sleep.  Settings of an autotitration capable CPAP from 5-18 cm water, 3  cm EPR and heated humidity with a mask of patient's choice and  comfort.  Interpreting Physician: Larey Seat, MD   Cc DDS -Dr Doy Mince needs a copy!

## 2019-11-25 NOTE — Addendum Note (Signed)
Addended by: Larey Seat on: 11/25/2019 05:26 PM   Modules accepted: Orders

## 2019-11-29 ENCOUNTER — Telehealth: Payer: Self-pay | Admitting: Neurology

## 2019-11-29 NOTE — Telephone Encounter (Signed)
I called pt. I advised pt that Dr. Brett Fairy reviewed their sleep study results and found that pt has sleep study. Dr. Brett Fairy recommends that pt has moderate to severe sleep apnea. I reviewed PAP compliance expectations with the pt. Pt is agreeable to starting a CPAP. I advised pt that an order will be sent to a DME, [facility], and [facility] will call the pt within about one week after they file with the pt's insurance. [facility] will show the pt how to use the machine, fit for masks, and troubleshoot the CPAP if needed. A follow up appt was made for insurance purposes with Dr. Brett Fairy on June 21,2021 at 1:30 pm. Pt verbalized understanding to arrive 15 minutes early and bring their CPAP. A letter with all of this information in it will be mailed to the pt as a reminder. I verified with the pt that the address we have on file is correct. Pt verbalized understanding of results. Pt had no questions at this time but was encouraged to call back if questions arise. I have sent the order to [facility] and have received confirmation that they have received the order.

## 2019-11-29 NOTE — Telephone Encounter (Signed)
-----   Message from Larey Seat, MD sent at 11/25/2019  5:25 PM EDT ----- Summary & Diagnosis:   Moderate to severe Obstructive Sleep Apnea with an AHI of 30.7/h  and REM sleep exacerbation to AHI 49.7/h. mild sleep hypoxia was  recorded, and normal heart rate variability was found. Loud  snoring is suggested by RDI.  Supine sleep caused the AHI to rise significantly and should be  avoided.    Recommendations:      This is REM sleep dependent apnea and at a high degree- the best  treatment is CPAP - Positive Airway pressure therapy. A dental  device would not overcome the thoracic weakness and lack of air  exchange in REM sleep.  Settings of an autotitration capable CPAP from 5-18 cm water, 3  cm EPR and heated humidity with a mask of patient's choice and  comfort.  Interpreting Physician: Larey Seat, MD   Cc DDS -Dr Doy Mince needs a copy!

## 2019-12-06 DIAGNOSIS — M810 Age-related osteoporosis without current pathological fracture: Secondary | ICD-10-CM | POA: Diagnosis not present

## 2019-12-06 DIAGNOSIS — I10 Essential (primary) hypertension: Secondary | ICD-10-CM | POA: Diagnosis not present

## 2019-12-06 DIAGNOSIS — E782 Mixed hyperlipidemia: Secondary | ICD-10-CM | POA: Diagnosis not present

## 2019-12-20 DIAGNOSIS — G4733 Obstructive sleep apnea (adult) (pediatric): Secondary | ICD-10-CM | POA: Diagnosis not present

## 2019-12-20 DIAGNOSIS — L7211 Pilar cyst: Secondary | ICD-10-CM | POA: Diagnosis not present

## 2019-12-21 DIAGNOSIS — L7211 Pilar cyst: Secondary | ICD-10-CM | POA: Diagnosis not present

## 2019-12-21 DIAGNOSIS — L989 Disorder of the skin and subcutaneous tissue, unspecified: Secondary | ICD-10-CM | POA: Diagnosis not present

## 2020-01-20 DIAGNOSIS — G4733 Obstructive sleep apnea (adult) (pediatric): Secondary | ICD-10-CM | POA: Diagnosis not present

## 2020-01-26 DIAGNOSIS — Z0000 Encounter for general adult medical examination without abnormal findings: Secondary | ICD-10-CM | POA: Diagnosis not present

## 2020-01-26 DIAGNOSIS — M259 Joint disorder, unspecified: Secondary | ICD-10-CM | POA: Diagnosis not present

## 2020-01-26 DIAGNOSIS — E782 Mixed hyperlipidemia: Secondary | ICD-10-CM | POA: Diagnosis not present

## 2020-01-26 DIAGNOSIS — Z833 Family history of diabetes mellitus: Secondary | ICD-10-CM | POA: Diagnosis not present

## 2020-01-26 DIAGNOSIS — R7309 Other abnormal glucose: Secondary | ICD-10-CM | POA: Diagnosis not present

## 2020-01-27 DIAGNOSIS — E039 Hypothyroidism, unspecified: Secondary | ICD-10-CM | POA: Diagnosis not present

## 2020-01-27 DIAGNOSIS — E890 Postprocedural hypothyroidism: Secondary | ICD-10-CM | POA: Diagnosis not present

## 2020-01-31 ENCOUNTER — Ambulatory Visit: Payer: Self-pay | Admitting: Neurology

## 2020-02-01 DIAGNOSIS — D2261 Melanocytic nevi of right upper limb, including shoulder: Secondary | ICD-10-CM | POA: Diagnosis not present

## 2020-02-01 DIAGNOSIS — D2372 Other benign neoplasm of skin of left lower limb, including hip: Secondary | ICD-10-CM | POA: Diagnosis not present

## 2020-02-01 DIAGNOSIS — L723 Sebaceous cyst: Secondary | ICD-10-CM | POA: Diagnosis not present

## 2020-02-01 DIAGNOSIS — D1722 Benign lipomatous neoplasm of skin and subcutaneous tissue of left arm: Secondary | ICD-10-CM | POA: Diagnosis not present

## 2020-02-01 DIAGNOSIS — D225 Melanocytic nevi of trunk: Secondary | ICD-10-CM | POA: Diagnosis not present

## 2020-02-01 DIAGNOSIS — L814 Other melanin hyperpigmentation: Secondary | ICD-10-CM | POA: Diagnosis not present

## 2020-02-01 DIAGNOSIS — L821 Other seborrheic keratosis: Secondary | ICD-10-CM | POA: Diagnosis not present

## 2020-02-01 DIAGNOSIS — D692 Other nonthrombocytopenic purpura: Secondary | ICD-10-CM | POA: Diagnosis not present

## 2020-02-02 ENCOUNTER — Other Ambulatory Visit: Payer: Self-pay

## 2020-02-02 ENCOUNTER — Encounter: Payer: Self-pay | Admitting: Neurology

## 2020-02-02 ENCOUNTER — Ambulatory Visit: Payer: PPO | Admitting: Neurology

## 2020-02-02 VITALS — BP 112/76 | HR 70 | Ht 61.0 in | Wt 140.0 lb

## 2020-02-02 DIAGNOSIS — G4733 Obstructive sleep apnea (adult) (pediatric): Secondary | ICD-10-CM

## 2020-02-02 DIAGNOSIS — R0683 Snoring: Secondary | ICD-10-CM | POA: Diagnosis not present

## 2020-02-02 DIAGNOSIS — I209 Angina pectoris, unspecified: Secondary | ICD-10-CM

## 2020-02-02 DIAGNOSIS — I10 Essential (primary) hypertension: Secondary | ICD-10-CM

## 2020-02-02 DIAGNOSIS — I773 Arterial fibromuscular dysplasia: Secondary | ICD-10-CM

## 2020-02-02 NOTE — Progress Notes (Signed)
SLEEP MEDICINE CLINIC    Provider:  Larey Seat, MD  Primary Care Physician:  Lawerance Cruel, MD [place] [address]     Referring Provider: Lawerance Cruel, [address],  [place] [address]          Chief Complaint according to patient   Patient presents with:    . New Patient (Initial Visit)     pt alone, rm 10. pt states that her orthrodontist questions that she has a restricted airway. There was also a spot that was found on the right side of the brain that he mentioned could be of concern. never had SS. pt snores intermittenly. she has had snoring wake her up but unsure of apnea. she has been having trouble sleeping.       RV 02-02-2020: Patient underwent a sleep study on 11-15-2019 which diagnosed her with moderate to severe obstructive sleep apnea and an AHI of 30.7 and in rem sleep and exacerbation of the AHI to 49.7/h.  Mild sleep hypoxia was recorded and normal heart rate variability.  Louder snoring was present as indicated by RDI the patient started on CPAP between 5 and 18 cmH2O with 3 cm EPR, she reports today that sometimes she just loses the CPAP she is using a DreamWear nasal pillow and apparently sometimes wakes up and finds the appliance next to her.  Her average user time however has been 4 hours 45 minutes and her compliance has been 29 out of 30 days is 57% of more than 4 hours of consecutive use.  Minimum setting is as indicated 5 maximum setting 18 cm water pressure there is a 3 cm EPR and her residual AHI is 3.7.  The 95th percentile pressure of CPAP is 7.6 cmH2O so I think we can lower limit her maximum pressure offer from 18-12 and therefore prevent excessive airflow of the feeling that air is blowing out him too high level.  The leakage has actually been well controlled usage has significantly improved over the last month and overall her AHI and most nights is under 3.  I would continue with the current settings but  I reduce the upper limit of pressure from 18 to 12 cm water.   The patient reports a good response , she feels better rested .     HISTORY OF PRESENT ILLNESS:  Alison [name] is a 69  year old Caucasian  female patient seen here upon Dentist Dr. Doy Mince referral on 02/02/2020 . Chief concern according to patient :  I am waking myself snoring,  Alison [name] had seen her dentist who performed a panoramic x-ray of her oral cavity and found that she had a very narrow airway as documented there.  He was suspicious that she may have possible sleep apnea, she also has a history of acid reflux she had already undergone esophageal stretching procedures.  However her upper airway had not been evaluated or treated in any way.  It is possible that she has laryngeal pharyngeal reflux causing swelling of the epiglottis and soft palate.  Supportively, she has noted that when she sleeps supine she wakes much more likely up from snoring and may be gasping.  She denies coughing in the middle of the night and usually does not have a bad taste in her mouth when she wakes up.   I have the pleasure of seeing Alison [name] , a right-handed Caucasian female with a possible  sleep disorder.  She  has a past medical history of Arthritis, Chest pain, Depression, Fibromuscular dysplasia ([place]), GERD (gastroesophageal reflux disease), Hypercholesteremia, Hypertension, Hyperthyroidism, and Osteoporosis.Bruxism.     Sleep relevant medical history: always feeling tired-  HTN, no nocturia, Sleep walking in childhood until mid- twenties. Esophageal reflux, stretching procedure.    Family medical /sleep history: Mother died at age 46 of GBM- no other family member on CPAP with OSA, insomnia, nor sleep walkers.    Social history: Patient is retired from [employer]-  and lives in a household with spouse , they have adult children- Pets are present. One dog.  Tobacco use- never .  ETOH use ; seldomly. Caffeine  intake in form of Coffee( none ) Soda( none ) Tea ( none) and no energy drinks. Due to GERD> Regular exercise in form of walking every day. hobby - horses.   Sleep habits are as follows: The patient's dinner time is between 6.30 PM. The patient goes to bed at 11 PM and continues to sleep for several hours, wakes rarely for bathroom breaks, the first time at 3-4 AM.   The preferred sleep position is on her side, with the support of 1 pillow- COOP, dense- helps neck pain. Dreams are reportedly present/vivid.  7.30 AM is the usual rise time. The patient wakes up spontaneously.  She reports not feeling refreshed or restored in AM, with symptoms such as dry mouth, morning headaches( heavy head ) , and residual fatigue.  Naps are taken infrequently, lasting from 15 minutes and are more refreshing than nocturnal sleep.    Review of Systems: Out of a complete 14 system review, the patient complains of only the following symptoms, and all other reviewed systems are negative. Depression during the pandemic.   Fatigue ( high ),snoring, sometimes fragmented sleep, Insomnia rarely, SAD seasonal depressive disorder.    How likely are you to doze in the following situations: 0 = not likely, 1 = slight chance, 2 = moderate chance, 3 = high chance   Sitting and Reading? Watching Television? Sitting inactive in a public place (theater or meeting)? As a passenger in a car for an hour without a break? Lying down in the afternoon when circumstances permit? Sitting and talking to someone? Sitting quietly after lunch without alcohol? In a car, while stopped for a few minutes in traffic?   Total =4 from / 24 points   FSS endorsed at  32 from 52 / 63 points.    Worsening since thyroid tumor removed, L thyroxine 112, but feeling not well- TSH is in normal range.    Social History   Socioeconomic History  . Marital status: Married    Spouse name: Not on file  . Number of children: 2  . Years of education:  Not on file  . Highest education level: Not on file  Occupational History  . Occupation: Retired    Fish farm manager: [employer]  Tobacco Use  . Smoking status: Never Smoker  . Smokeless tobacco: Never Used  Substance and Sexual Activity  . Alcohol use: Yes    Comment: social  . Drug use: No  . Sexual activity: Yes    Birth control/protection: None  Other Topics Concern  . Not on file  Social History Narrative  . Not on file   Social Determinants of Health   Financial Resource Strain:   . Difficulty of Paying Living Expenses:   Food Insecurity:   . Worried About Charity fundraiser in  the Last Year:   . [place] in the Last Year:   Transportation Needs:   . Film/video editor (Medical):   Marland Kitchen Lack of Transportation (Non-Medical):   Physical Activity:   . Days of Exercise per Week:   . Minutes of Exercise per Session:   Stress:   . Feeling of Stress :   Social Connections:   . Frequency of Communication with Friends and Family:   . Frequency of Social Gatherings with Friends and Family:   . Attends Religious Services:   . Active Member of Clubs or Organizations:   . Attends Archivist Meetings:   Marland Kitchen Marital Status:     Family History  Problem Relation Age of Onset  . Hyperlipidemia Mother   . Brain cancer Mother   . Heart disease Father        Vavle replacement   . Hyperlipidemia Father   . Diabetes Father   . Peripheral vascular disease Father   . Atrial fibrillation Father   . Osteoarthritis Paternal Aunt   . Lupus Paternal Uncle   . Hypothyroidism Sister   . Anesthesia problems Neg Hx   . Hypotension Neg Hx   . Malignant hyperthermia Neg Hx   . Pseudochol deficiency Neg Hx     Past Medical History:  Diagnosis Date  . Arthritis   . Chest pain   . Depression   . Fibromuscular dysplasia ([place])   . GERD (gastroesophageal reflux disease)   . Hypercholesteremia   . Hypertension    on meds  . Hyperthyroidism   . Osteoporosis     Past Surgical  History:  Procedure Laterality Date  . ABDOMINAL HYSTERECTOMY    . APPENDECTOMY     1969  . CARDIAC CATHETERIZATION     normal cath , chest pains due to stress  . CESAREAN SECTION  1985  . colonscopy  2009  . LUMBAR LAMINECTOMY/DECOMPRESSION MICRODISCECTOMY  12/23/2011   Procedure: LUMBAR LAMINECTOMY/DECOMPRESSION MICRODISCECTOMY 1 LEVEL;  Surgeon: Hosie Spangle, MD;  Location: [facility];  Service: Neurosurgery;  Laterality: Right;  RIGHT Lumbar two- three laminotomy and microdiskectomy  . ORIF WRIST FRACTURE  2006  . THYROIDECTOMY N/A 01/28/2019   Procedure: TOTAL THYROIDECTOMY;  Surgeon: Armandina Gemma, MD;  Location: [facility];  Service: General;  Laterality: N/A;  . UPPER GI ENDOSCOPY       Current Outpatient Medications on File Prior to Visit  Medication Sig Dispense Refill  . amLODipine-benazepril (LOTREL) 5-10 MG per capsule Take 1 capsule by mouth every other day.     . Biotin 1000 MCG tablet Take 1,000 mcg by mouth every other day.     Marland Kitchen buPROPion (WELLBUTRIN XL) 300 MG 24 hr tablet Take 300 mg by mouth daily.    . Cholecalciferol (VITAMIN D-3) 25 MCG (1000 UT) CAPS Take 1,000 Units by mouth daily.     . Coenzyme Q10 (CO Q-10) 200 MG CAPS Take 200 mg by mouth daily.     Marland Kitchen levothyroxine (SYNTHROID) 112 MCG tablet TAKE ONE TABLET BY MOUTH EVERY MORNING ON A.N EMPTY STOMACH EXCEPT TAKE ONE-HALF TABLET ON SUNDAY    . Multiple Vitamins-Minerals (ONE-A-DAY WOMENS 50 PLUS PO) Take 1 tablet by mouth daily.    Marland Kitchen omeprazole (PRILOSEC) 20 MG capsule Take 20 mg by mouth every 3 (three) days.     Marland Kitchen PROLIA 60 MG/ML SOSY injection Inject 60 mg into the muscle every 6 (six) months.  1  . simvastatin (ZOCOR) 20  MG tablet Take 20 mg by mouth daily.     No current facility-administered medications on file prior to visit.    Allergies  Allergen Reactions  . Shellfish Allergy Anaphylaxis and Hives  . Betadine [Povidone Iodine] Other (See Comments)    Unspecified reaction to topical betadine  used for skin prep.   Marland Kitchen Penicillins Hives and Swelling    Did it involve swelling of the face/tongue/throat, SOB, or low BP? Unknown Did it involve sudden or severe rash/hives, skin peeling, or any reaction on the inside of your mouth or nose? Unknown Did you need to seek medical attention at a hospital or doctor's office? Unknown When did it last happen?childhood allergy If all above answers are "NO", may proceed with cephalosporin use.   . Tetracycline Hcl Hives  . Tramadol Nausea And Vomiting    Physical exam:  Today's Vitals   02/02/20 1356  BP: 112/76  Pulse: 70  Weight: 140 lb (63.5 kg)  Height: 5\' 1"  (1.549 m)   Body mass index is 26.45 kg/m.   Wt Readings from Last 3 Encounters:  02/02/20 140 lb (63.5 kg)  11/02/19 142 lb (64.4 kg)  01/28/19 139 lb 4 oz (63.2 kg)     Ht Readings from Last 3 Encounters:  02/02/20 5\' 1"  (1.549 m)  11/02/19 5\' 1"  (1.549 m)  01/28/19 5\' 1"  (1.549 m)      General: The patient is awake, alert and appears not in acute distress. The patient is well groomed. Head: Normocephalic, atraumatic. Neck is supple. Mallampati: 3 plus. neck circumference:14 inches.  Nasal airflow patent. Retrognathia is not seen.  Dental status:   Cardiovascular:  Regular rate and cardiac rhythm by pulse,  without distended neck veins. Respiratory: Lungs are clear to auscultation.  Skin:  Without evidence of ankle edema. Trunk: The patient's posture is erect.   Neurologic exam : The patient is awake and alert, oriented to place and time.   Memory subjective described as intact.  Attention span & concentration ability appears normal.  Speech is fluent,  without  dysarthria, dysphonia or aphasia.  Mood and affect are appropriate.   Cranial nerves: no loss of smell or taste reported  Pupils are equal and briskly reactive to light. Funduscopic exam deferred.   Extraocular movements in vertical and horizontal planes were intact and without nystagmus. No  Diplopia. Visual fields by finger perimetry are intact. Hearing was intact to soft voice and finger rubbing.    Facial sensation intact to fine touch.  Facial motor strength is symmetric and tongue and uvula move midline.  Neck ROM : rotation, tilt and flexion extension were normal for age and shoulder shrug was symmetrical.    Motor exam:  Symmetric bulk, tone and ROM.   Normal tone without cog wheeling, symmetric grip strength.   Sensory:  Fine touch, pinprick and vibration were tested  and  normal.  Proprioception tested in the upper extremities was normal.   Coordination: Rapid alternating movements in the fingers/hands were of normal speed.  The Finger-to-nose maneuver was intact without evidence of ataxia, dysmetria or tremor.   Gait and station: Patient could rise unassisted from a seated position, walked without assistive device ( RN report) .  Stance is of normal width/ base .  Toe and heel walk were deferred.  Deep tendon reflexes: in the upper and lower extremities are symmetric and intact.  Babinski response was deferred.      After spending a total time of 20 minutes face  to face and additional time for physical and neurologic examination, review of laboratory studies,  personal review of imaging studies, reports and results of other testing and review of referral information / records as far as provided in visit, I have established the following assessments:  1) she is less  fatigued and has less early morning awakenings- snoring is controlled. Her challenge is getting to bed earlier.   She has improved her CPAP compliance, I will reduce the setting for upper pressure window.  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 .    I would like to thank Dr DDS Tyron Russell for allowing me to meet with and to take care of this pleasant patient.   In short, Alison [name] is presenting with fatigue and a short, narrow airway  and snoring.   I plan to follow up either personally or through our NP within 2-3 month.    Electronically signed by: Larey Seat, MD 02/02/2020 2:08 PM  [facility] Board certified by The [employer] of Sleep Medicine and Diplomate of the [employer] of Sleep Medicine. Board certified In Neurology through the [place], Fellow of the [employer] of Neurology. Medical Director of [facility].

## 2020-02-02 NOTE — Patient Instructions (Signed)

## 2020-02-03 DIAGNOSIS — F430 Acute stress reaction: Secondary | ICD-10-CM | POA: Diagnosis not present

## 2020-02-03 DIAGNOSIS — E782 Mixed hyperlipidemia: Secondary | ICD-10-CM | POA: Diagnosis not present

## 2020-02-03 DIAGNOSIS — E559 Vitamin D deficiency, unspecified: Secondary | ICD-10-CM | POA: Diagnosis not present

## 2020-02-03 DIAGNOSIS — I10 Essential (primary) hypertension: Secondary | ICD-10-CM | POA: Diagnosis not present

## 2020-02-03 DIAGNOSIS — R7303 Prediabetes: Secondary | ICD-10-CM | POA: Diagnosis not present

## 2020-02-03 DIAGNOSIS — K219 Gastro-esophageal reflux disease without esophagitis: Secondary | ICD-10-CM | POA: Diagnosis not present

## 2020-02-03 DIAGNOSIS — Z0000 Encounter for general adult medical examination without abnormal findings: Secondary | ICD-10-CM | POA: Diagnosis not present

## 2020-02-16 DIAGNOSIS — G4733 Obstructive sleep apnea (adult) (pediatric): Secondary | ICD-10-CM | POA: Diagnosis not present

## 2020-02-19 DIAGNOSIS — G4733 Obstructive sleep apnea (adult) (pediatric): Secondary | ICD-10-CM | POA: Diagnosis not present

## 2020-03-10 DIAGNOSIS — I10 Essential (primary) hypertension: Secondary | ICD-10-CM | POA: Diagnosis not present

## 2020-03-10 DIAGNOSIS — M810 Age-related osteoporosis without current pathological fracture: Secondary | ICD-10-CM | POA: Diagnosis not present

## 2020-03-10 DIAGNOSIS — E782 Mixed hyperlipidemia: Secondary | ICD-10-CM | POA: Diagnosis not present

## 2020-04-04 DIAGNOSIS — E890 Postprocedural hypothyroidism: Secondary | ICD-10-CM | POA: Diagnosis not present

## 2020-04-04 DIAGNOSIS — Z6827 Body mass index (BMI) 27.0-27.9, adult: Secondary | ICD-10-CM | POA: Diagnosis not present

## 2020-04-04 DIAGNOSIS — M810 Age-related osteoporosis without current pathological fracture: Secondary | ICD-10-CM | POA: Diagnosis not present

## 2020-04-04 DIAGNOSIS — C73 Malignant neoplasm of thyroid gland: Secondary | ICD-10-CM | POA: Diagnosis not present

## 2020-04-04 DIAGNOSIS — L649 Androgenic alopecia, unspecified: Secondary | ICD-10-CM | POA: Diagnosis not present

## 2020-04-04 DIAGNOSIS — R7303 Prediabetes: Secondary | ICD-10-CM | POA: Diagnosis not present

## 2020-05-16 DIAGNOSIS — G4733 Obstructive sleep apnea (adult) (pediatric): Secondary | ICD-10-CM | POA: Diagnosis not present

## 2020-06-07 DIAGNOSIS — E782 Mixed hyperlipidemia: Secondary | ICD-10-CM | POA: Diagnosis not present

## 2020-06-07 DIAGNOSIS — M810 Age-related osteoporosis without current pathological fracture: Secondary | ICD-10-CM | POA: Diagnosis not present

## 2020-06-07 DIAGNOSIS — I10 Essential (primary) hypertension: Secondary | ICD-10-CM | POA: Diagnosis not present

## 2020-06-12 DIAGNOSIS — M810 Age-related osteoporosis without current pathological fracture: Secondary | ICD-10-CM | POA: Diagnosis not present

## 2020-06-21 DIAGNOSIS — G4733 Obstructive sleep apnea (adult) (pediatric): Secondary | ICD-10-CM | POA: Diagnosis not present

## 2020-07-04 DIAGNOSIS — R1310 Dysphagia, unspecified: Secondary | ICD-10-CM | POA: Diagnosis not present

## 2020-07-21 DIAGNOSIS — G4733 Obstructive sleep apnea (adult) (pediatric): Secondary | ICD-10-CM | POA: Diagnosis not present

## 2020-08-09 DIAGNOSIS — E782 Mixed hyperlipidemia: Secondary | ICD-10-CM | POA: Diagnosis not present

## 2020-08-09 DIAGNOSIS — I10 Essential (primary) hypertension: Secondary | ICD-10-CM | POA: Diagnosis not present

## 2020-08-09 DIAGNOSIS — M810 Age-related osteoporosis without current pathological fracture: Secondary | ICD-10-CM | POA: Diagnosis not present

## 2020-08-09 DIAGNOSIS — K219 Gastro-esophageal reflux disease without esophagitis: Secondary | ICD-10-CM | POA: Diagnosis not present

## 2020-08-14 DIAGNOSIS — Z1152 Encounter for screening for COVID-19: Secondary | ICD-10-CM | POA: Diagnosis not present

## 2020-08-14 DIAGNOSIS — U071 COVID-19: Secondary | ICD-10-CM | POA: Diagnosis not present

## 2020-08-16 DIAGNOSIS — G4733 Obstructive sleep apnea (adult) (pediatric): Secondary | ICD-10-CM | POA: Diagnosis not present

## 2020-08-21 DIAGNOSIS — G4733 Obstructive sleep apnea (adult) (pediatric): Secondary | ICD-10-CM | POA: Diagnosis not present

## 2020-09-02 DIAGNOSIS — M810 Age-related osteoporosis without current pathological fracture: Secondary | ICD-10-CM | POA: Diagnosis not present

## 2020-09-02 DIAGNOSIS — K219 Gastro-esophageal reflux disease without esophagitis: Secondary | ICD-10-CM | POA: Diagnosis not present

## 2020-09-02 DIAGNOSIS — I10 Essential (primary) hypertension: Secondary | ICD-10-CM | POA: Diagnosis not present

## 2020-09-02 DIAGNOSIS — E782 Mixed hyperlipidemia: Secondary | ICD-10-CM | POA: Diagnosis not present

## 2020-09-21 DIAGNOSIS — G4733 Obstructive sleep apnea (adult) (pediatric): Secondary | ICD-10-CM | POA: Diagnosis not present

## 2020-09-26 DIAGNOSIS — R7303 Prediabetes: Secondary | ICD-10-CM | POA: Diagnosis not present

## 2020-09-26 DIAGNOSIS — C73 Malignant neoplasm of thyroid gland: Secondary | ICD-10-CM | POA: Diagnosis not present

## 2020-10-03 DIAGNOSIS — L659 Nonscarring hair loss, unspecified: Secondary | ICD-10-CM | POA: Diagnosis not present

## 2020-10-03 DIAGNOSIS — C73 Malignant neoplasm of thyroid gland: Secondary | ICD-10-CM | POA: Diagnosis not present

## 2020-10-03 DIAGNOSIS — E890 Postprocedural hypothyroidism: Secondary | ICD-10-CM | POA: Diagnosis not present

## 2020-10-03 DIAGNOSIS — R7303 Prediabetes: Secondary | ICD-10-CM | POA: Diagnosis not present

## 2020-10-03 DIAGNOSIS — M810 Age-related osteoporosis without current pathological fracture: Secondary | ICD-10-CM | POA: Diagnosis not present

## 2020-10-03 DIAGNOSIS — Z6827 Body mass index (BMI) 27.0-27.9, adult: Secondary | ICD-10-CM | POA: Diagnosis not present

## 2020-10-04 DIAGNOSIS — Z01812 Encounter for preprocedural laboratory examination: Secondary | ICD-10-CM | POA: Diagnosis not present

## 2020-10-09 DIAGNOSIS — K222 Esophageal obstruction: Secondary | ICD-10-CM | POA: Diagnosis not present

## 2020-10-09 DIAGNOSIS — Q399 Congenital malformation of esophagus, unspecified: Secondary | ICD-10-CM | POA: Diagnosis not present

## 2020-10-09 DIAGNOSIS — K2930 Chronic superficial gastritis without bleeding: Secondary | ICD-10-CM | POA: Diagnosis not present

## 2020-10-09 DIAGNOSIS — K31A11 Gastric intestinal metaplasia without dysplasia, involving the antrum: Secondary | ICD-10-CM | POA: Diagnosis not present

## 2020-10-09 DIAGNOSIS — K317 Polyp of stomach and duodenum: Secondary | ICD-10-CM | POA: Diagnosis not present

## 2020-10-09 DIAGNOSIS — K31A19 Gastric intestinal metaplasia without dysplasia, unspecified site: Secondary | ICD-10-CM | POA: Diagnosis not present

## 2020-10-09 DIAGNOSIS — K219 Gastro-esophageal reflux disease without esophagitis: Secondary | ICD-10-CM | POA: Diagnosis not present

## 2020-10-09 DIAGNOSIS — K2940 Chronic atrophic gastritis without bleeding: Secondary | ICD-10-CM | POA: Diagnosis not present

## 2020-10-09 DIAGNOSIS — K449 Diaphragmatic hernia without obstruction or gangrene: Secondary | ICD-10-CM | POA: Diagnosis not present

## 2020-10-09 DIAGNOSIS — K3189 Other diseases of stomach and duodenum: Secondary | ICD-10-CM | POA: Diagnosis not present

## 2020-10-09 DIAGNOSIS — R1310 Dysphagia, unspecified: Secondary | ICD-10-CM | POA: Diagnosis not present

## 2020-10-13 DIAGNOSIS — K2940 Chronic atrophic gastritis without bleeding: Secondary | ICD-10-CM | POA: Diagnosis not present

## 2020-10-13 DIAGNOSIS — K31A19 Gastric intestinal metaplasia without dysplasia, unspecified site: Secondary | ICD-10-CM | POA: Diagnosis not present

## 2020-10-13 DIAGNOSIS — K219 Gastro-esophageal reflux disease without esophagitis: Secondary | ICD-10-CM | POA: Diagnosis not present

## 2020-10-19 DIAGNOSIS — G4733 Obstructive sleep apnea (adult) (pediatric): Secondary | ICD-10-CM | POA: Diagnosis not present

## 2020-11-18 DIAGNOSIS — R0981 Nasal congestion: Secondary | ICD-10-CM | POA: Diagnosis not present

## 2020-11-18 DIAGNOSIS — Z03818 Encounter for observation for suspected exposure to other biological agents ruled out: Secondary | ICD-10-CM | POA: Diagnosis not present

## 2020-11-18 DIAGNOSIS — R059 Cough, unspecified: Secondary | ICD-10-CM | POA: Diagnosis not present

## 2020-11-18 DIAGNOSIS — J019 Acute sinusitis, unspecified: Secondary | ICD-10-CM | POA: Diagnosis not present

## 2020-11-19 DIAGNOSIS — G4733 Obstructive sleep apnea (adult) (pediatric): Secondary | ICD-10-CM | POA: Diagnosis not present

## 2020-11-29 DIAGNOSIS — M810 Age-related osteoporosis without current pathological fracture: Secondary | ICD-10-CM | POA: Diagnosis not present

## 2020-11-29 DIAGNOSIS — I10 Essential (primary) hypertension: Secondary | ICD-10-CM | POA: Diagnosis not present

## 2020-11-29 DIAGNOSIS — K219 Gastro-esophageal reflux disease without esophagitis: Secondary | ICD-10-CM | POA: Diagnosis not present

## 2020-11-29 DIAGNOSIS — E782 Mixed hyperlipidemia: Secondary | ICD-10-CM | POA: Diagnosis not present

## 2020-12-15 DIAGNOSIS — G4733 Obstructive sleep apnea (adult) (pediatric): Secondary | ICD-10-CM | POA: Diagnosis not present

## 2020-12-19 DIAGNOSIS — G4733 Obstructive sleep apnea (adult) (pediatric): Secondary | ICD-10-CM | POA: Diagnosis not present

## 2020-12-27 DIAGNOSIS — Z1231 Encounter for screening mammogram for malignant neoplasm of breast: Secondary | ICD-10-CM | POA: Diagnosis not present

## 2021-01-04 DIAGNOSIS — M810 Age-related osteoporosis without current pathological fracture: Secondary | ICD-10-CM | POA: Diagnosis not present

## 2021-01-19 DIAGNOSIS — G4733 Obstructive sleep apnea (adult) (pediatric): Secondary | ICD-10-CM | POA: Diagnosis not present

## 2021-02-05 ENCOUNTER — Ambulatory Visit: Payer: PPO | Admitting: Neurology

## 2021-02-07 ENCOUNTER — Encounter: Payer: Self-pay | Admitting: Neurology

## 2021-02-13 ENCOUNTER — Encounter: Payer: Self-pay | Admitting: Neurology

## 2021-02-13 ENCOUNTER — Ambulatory Visit: Payer: PPO | Admitting: Neurology

## 2021-02-13 VITALS — BP 127/71 | HR 78 | Ht 61.5 in | Wt 141.0 lb

## 2021-02-13 DIAGNOSIS — R5383 Other fatigue: Secondary | ICD-10-CM

## 2021-02-13 DIAGNOSIS — G4733 Obstructive sleep apnea (adult) (pediatric): Secondary | ICD-10-CM

## 2021-02-13 DIAGNOSIS — G4700 Insomnia, unspecified: Secondary | ICD-10-CM | POA: Diagnosis not present

## 2021-02-13 DIAGNOSIS — Z9989 Dependence on other enabling machines and devices: Secondary | ICD-10-CM

## 2021-02-13 DIAGNOSIS — G4731 Primary central sleep apnea: Secondary | ICD-10-CM | POA: Diagnosis not present

## 2021-02-13 DIAGNOSIS — I773 Arterial fibromuscular dysplasia: Secondary | ICD-10-CM

## 2021-02-13 MED ORDER — TRAZODONE HCL 50 MG PO TABS: 50.0000 mg | ORAL_TABLET | Freq: Every evening | ORAL | 5 refills | Status: DC | PRN

## 2021-02-13 NOTE — Progress Notes (Signed)
SLEEP MEDICINE CLINIC    Provider:  Larey Seat, MD  Primary Care Physician:  Lawerance Cruel, MD [place] [address]     Referring Provider: Lawerance Cruel, [place],  [place] [address]          Chief Complaint according to patient   Patient presents with:     New Patient (Initial Visit)     pt alone, rm 10. pt states that her orthrodontist questions that she has a restricted airway. There was also a spot that was found on the right side of the brain that he mentioned could be of concern. never had SS. pt snores intermittenly. she has had [name] wake her up but unsure of apnea. she has been having trouble sleeping.     RV 02-13-2021 CD: I have the pleasure t see Alison [name] today, a meanwhile 70 year-old.  With OSA on CPAP- and poor compliance data- AHI is very good, 3.3/h , she takes her CPAP off in her sleep- but she noted better sleep when the CPAP was worn.  90% of days but only 37% of hours. She snores and is now aware- had been sleeping with her daughter in one hotel room when not using CPAP, and the family was witnessing [name] and apnea, not central.  She feels her HT was not a good representation of her sleep and would like to repeat. I am hapy to order a repeat HST for her.      RV 02-02-2020: Patient underwent a sleep study on 11-15-2019 which diagnosed her with moderate to severe obstructive sleep apnea and an AHI of 30.7/h and in rem sleep and exacerbation of the AHI to 49.7/h.  Mild sleep hypoxia was recorded and normal heart rate variability.  Alison [name] was present as indicated by RDI the patient started on CPAP between 5 and 18 cmH2O with 3 cm EPR, she reports today that sometimes she just loses the CPAP she is using a DreamWear nasal pillow and apparently sometimes wakes up and finds the appliance next to her.  Her average user time however has been 4 hours 45 minutes and her compliance has been 29 out of 30  days is 57% of more than 4 hours of consecutive use.  Minimum setting is as indicated 5 maximum setting 18 cm water pressure there is a 3 cm EPR and her residual AHI is 3.7.  The 95th percentile pressure of CPAP is 7.6 cmH2O so I think we can lower limit her maximum pressure offer from 18-12 and therefore prevent excessive airflow of the feeling that air is blowing out him too high level.  The leakage has actually been well controlled usage has significantly improved over the last month and overall her AHI and most nights is under 3.  I would continue with the current settings but I reduce the upper limit of pressure from 18 to 12 cm water.   The patient reports a good response , she feels better rested .     HISTORY OF PRESENT ILLNESS:  Alison [name] is a 70  year old Caucasian  female patient seen here upon Dentist Dr. Doy Mince referral on 02/13/2021 . Chief concern according to patient :  I am waking myself [name],  Alison [name] had seen her dentist who performed a panoramic x-ray of her oral cavity and found that she had a very narrow airway as documented there.  He was suspicious that  she may have possible sleep apnea, she also has a history of acid reflux she had already undergone esophageal stretching procedures.  However her upper airway had not been evaluated or treated in any way.  It is possible that she has laryngeal pharyngeal reflux causing swelling of the epiglottis and soft palate.  Supportively, she has noted that when she sleeps supine she wakes much more likely up from [name] and may be gasping.  She denies coughing in the middle of the night and usually does not have a bad taste in her mouth when she wakes up.   I have the pleasure of seeing Alison [name] , a right-handed Caucasian female with a possible sleep disorder.  She  has a past medical history of Arthritis, Chest pain, Depression, Fibromuscular dysplasia ([place]), GERD (gastroesophageal reflux disease),  Hypercholesteremia, Hypertension, Hyperthyroidism, and Osteoporosis.Bruxism.     Sleep relevant medical history: always feeling tired-  HTN, no nocturia, Sleep walking in childhood until mid- twenties. Esophageal reflux, stretching procedure.    Family medical /sleep history: Mother died at age 72 of GBM- no other family member on CPAP with OSA, insomnia, nor sleep walkers.    Social history: Patient is retired from [employer]-  and lives in a household with spouse , they have adult children- Pets are present. One dog.  Tobacco use- never .  ETOH use ; seldomly. Caffeine intake in form of Coffee( none ) Soda( none ) Tea ( none) and no energy drinks. Due to GERD> Regular exercise in form of walking every day. hobby - horses.   Sleep habits are as follows: The patient's dinner time is between 6.30 PM. The patient goes to bed at 11 PM and continues to sleep for several hours, wakes rarely for bathroom breaks, the first time at 3-4 AM.   The preferred sleep position is on her side, with the support of 1 pillow- COOP, dense- helps neck pain. Dreams are reportedly present/vivid.  7.30 AM is the usual rise time. The patient wakes up spontaneously.  She reports not feeling refreshed or restored in AM, with symptoms such as dry mouth, morning headaches( heavy head ) , and residual fatigue.  Naps are taken infrequently, lasting from 15 minutes and are more refreshing than nocturnal sleep.    Review of Systems: Out of a complete 14 system review, the patient complains of only the following symptoms, and all other reviewed systems are negative. Depression during the pandemic.   Fatigue ( high ),[name], sometimes fragmented sleep, Insomnia rarely, SAD seasonal depressive disorder.    How likely are you to doze in the following situations: 0 = not likely, 1 = slight chance, 2 = moderate chance, 3 = high chance   Sitting and Reading? Watching Television? Sitting inactive in a public place  (theater or meeting)? As a passenger in a car for an hour without a break? Lying down in the afternoon when circumstances permit? Sitting and talking to someone? Sitting quietly after lunch without alcohol? In a car, while stopped for a few minutes in traffic?   Total =4 from / 24 points   FSS endorsed at  32 from 52 / 63 points.    Worsening since thyroid tumor removed, L thyroxine 112, but feeling not well- TSH is in normal range.       Patient feels her sleep has changed and she has been finally told that she snores, ( husband has stated she didn't last year /) yet has some residual  central apnea on CPAP. We would like to get a new baseline.    Social History   Socioeconomic History   Marital status: Married    Spouse name: Not on file   Number of children: 2   Years of education: Not on file   Highest education level: Not on file  Occupational History   Occupation: Retired    Fish farm manager: [employer]  Tobacco Use   Smoking status: Never   Smokeless tobacco: Never  Substance and Sexual Activity   Alcohol use: Yes    Comment: social   Drug use: No   Sexual activity: Yes    Birth control/protection: None  Other Topics Concern   Not on file  Social History Narrative   Not on file   Social Determinants of Health   Financial Resource Strain: Not on file  Food Insecurity: Not on file  Transportation Needs: Not on file  Physical Activity: Not on file  Stress: Not on file  Social Connections: Not on file    Family History  Problem Relation Age of Onset   Hyperlipidemia Mother    Brain cancer Mother    Heart disease Father        Vavle replacement    Hyperlipidemia Father    Diabetes Father    Peripheral vascular disease Father    Atrial fibrillation Father    Osteoarthritis Paternal Aunt    Lupus Paternal Uncle    Hypothyroidism Sister    Anesthesia problems Neg Hx    Hypotension Neg Hx    Malignant hyperthermia Neg Hx    Pseudochol deficiency Neg Hx     Past  Medical History:  Diagnosis Date   Arthritis    Chest pain    Depression    Fibromuscular dysplasia (HCC)    GERD (gastroesophageal reflux disease)    Hypercholesteremia    Hypertension    on meds   Hyperthyroidism    Osteoporosis     Past Surgical History:  Procedure Laterality Date   ABDOMINAL HYSTERECTOMY     APPENDECTOMY     1969   CARDIAC CATHETERIZATION     normal cath , chest pains due to stress   [place]   colonscopy  2009   LUMBAR LAMINECTOMY/DECOMPRESSION MICRODISCECTOMY  12/23/2011   Procedure: LUMBAR LAMINECTOMY/DECOMPRESSION MICRODISCECTOMY 1 LEVEL;  Surgeon: Hosie Spangle, MD;  Location: [facility];  Service: Neurosurgery;  Laterality: Right;  RIGHT Lumbar two- three laminotomy and microdiskectomy   ORIF WRIST FRACTURE  2006   THYROIDECTOMY N/A 01/28/2019   Procedure: TOTAL THYROIDECTOMY;  Surgeon: Armandina Gemma, MD;  Location: [facility];  Service: General;  Laterality: N/A;   UPPER GI ENDOSCOPY       Current Outpatient Medications on File Prior to Visit  Medication Sig Dispense Refill   amLODipine-benazepril (LOTREL) 5-10 MG per capsule Take 1 capsule by mouth every other day.      Biotin 1000 MCG tablet Take 1,000 mcg by mouth every other day.      buPROPion (WELLBUTRIN XL) 300 MG 24 hr tablet Take 300 mg by mouth daily.     Cholecalciferol (VITAMIN D-3) 25 MCG (1000 UT) CAPS Take 1,000 Units by mouth daily.      Coenzyme Q10 (CO Q-10) 200 MG CAPS Take 200 mg by mouth daily.      levothyroxine (SYNTHROID) 112 MCG tablet Take 112 mcg by mouth daily before breakfast.     Multiple Vitamins-Minerals (ONE-A-DAY WOMENS 50 PLUS PO) Take 1 tablet  by mouth daily.     omeprazole (PRILOSEC) 20 MG capsule Take 20 mg by mouth every 3 (three) days.      PROLIA 60 MG/ML SOSY injection Inject 60 mg into the muscle every 6 (six) months.  1   simvastatin (ZOCOR) 20 MG tablet Take 20 mg by mouth daily.     No current facility-administered medications on file  prior to visit.    Allergies  Allergen Reactions   Shellfish Allergy Anaphylaxis and Hives   Betadine [Povidone Iodine] Other (See Comments)    Unspecified reaction to topical betadine used for skin prep.    Penicillins Hives and Swelling    Did it involve swelling of the face/tongue/throat, SOB, or low BP? Unknown Did it involve sudden or severe rash/hives, skin peeling, or any reaction on the inside of your mouth or nose? Unknown Did you need to seek medical attention at a hospital or doctor's office? Unknown When did it last happen?      childhood allergy If all above answers are "NO", may proceed with cephalosporin use.    Tetracycline Hcl Hives   Tramadol Nausea And Vomiting    Physical exam:  Today's Vitals   02/13/21 1142  BP: 127/71  Pulse: 78  Weight: 141 lb (64 kg)  Height: 5' 1.5" (1.562 m)   Body mass index is 26.21 kg/m.   Wt Readings from Last 3 Encounters:  02/13/21 141 lb (64 kg)  02/02/20 140 lb (63.5 kg)  11/02/19 142 lb (64.4 kg)     Ht Readings from Last 3 Encounters:  02/13/21 5' 1.5" (1.562 m)  02/02/20 5\' 1"  (1.549 m)  11/02/19 5\' 1"  (1.549 m)      General: The patient is awake, alert and appears not in acute distress. The patient is well groomed. Head: Normocephalic, atraumatic. Neck is supple. Mallampati: 3 plus. neck circumference:14 inches.  Nasal airflow patent. Retrognathia is not seen.  Dental status:   Cardiovascular:  Regular rate and cardiac rhythm by pulse,  without distended neck veins. Respiratory: Lungs are clear to auscultation.  Skin:  Without evidence of ankle edema. Trunk: The patient's posture is erect.   Neurologic exam : The patient is awake and alert, oriented to place and time.   Memory subjective described as intact.  Attention span & concentration ability appears normal.  Speech is fluent,  without  dysarthria, dysphonia or aphasia.  Mood and affect are appropriate.   Cranial nerves: no loss of smell or taste  reported  Pupils are equal and briskly reactive to light. Funduscopic exam deferred.   Extraocular movements in vertical and horizontal planes were intact and without nystagmus. No Diplopia. Visual fields by finger perimetry are intact. Hearing was intact to soft voice and finger rubbing.    Facial sensation intact to fine touch.  Facial motor strength is symmetric and tongue and uvula move midline.  Neck ROM : rotation, tilt and flexion extension were normal for age and shoulder shrug was symmetrical.    Motor exam:  Symmetric bulk, tone and ROM.   Normal tone without cog wheeling, symmetric grip strength.   Sensory:  Fine touch, pinprick and vibration were tested  and  normal.  Proprioception tested in the upper extremities was normal.   Coordination: Rapid alternating movements in the fingers/hands were of normal speed.  The Finger-to-nose maneuver was intact without evidence of ataxia, dysmetria or tremor.   Gait and station: Patient could rise unassisted from a seated position, walked without assistive device (  RN report) .  Stance is of normal width/ base .  Toe and heel walk were deferred.  Deep tendon reflexes: in the upper and lower extremities are symmetric and intact.  Babinski response was deferred.      After spending a total time of 20 minutes face to face and additional time for physical and neurologic examination, review of laboratory studies,  personal review of imaging studies, reports and results of other testing and review of referral information / records as far as provided in visit, I have established the following assessments:  1) she is less  fatigued and has less early morning awakenings- [name] is controlled. Her challenge is getting to bed earlier.  She has improved her CPAP compliance, I will reduce the setting for upper pressure window. Patient feels her sleep has changed and she has been finally told that she snores, ( husband has stated she didn't last  year /) yet has some residual central apnea on CPAP. We would like to get a new baseline.    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 .  Husbands dementia is progressing, affecting communications.    I would like to thank Dr DDS Tyron Russell for allowing me to meet with and to take care of this pleasant patient.   In short, Alison [name] is presenting with fatigue and a short, narrow airway and [name].  Patient feels her sleep has changed and she has been finally told that she snores, ( husband has stated she didn't last year /) yet has some residual central apnea on CPAP. We would like to get a new baseline.   I plan to follow up either personally or through our NP within 2-4 month.    Electronically signed by: Larey Seat, MD 02/13/2021 11:45 AM  [facility] Board certified by The [employer] of Sleep Medicine and Diplomate of the [employer] of Sleep Medicine. Board certified In Neurology through the [place], Fellow of the [employer] of Neurology. Medical Director of [facility].

## 2021-02-13 NOTE — Patient Instructions (Signed)
Patient feels her sleep has changed and she has been finally told that she snores, ( husband has stated she didn't last year /) yet has some residual central apnea on CPAP. We would like to get a new baseline.   HST reordered-    Rv in 2-4 month.

## 2021-02-18 DIAGNOSIS — G4733 Obstructive sleep apnea (adult) (pediatric): Secondary | ICD-10-CM | POA: Diagnosis not present

## 2021-02-21 DIAGNOSIS — K219 Gastro-esophageal reflux disease without esophagitis: Secondary | ICD-10-CM | POA: Diagnosis not present

## 2021-02-21 DIAGNOSIS — E559 Vitamin D deficiency, unspecified: Secondary | ICD-10-CM | POA: Diagnosis not present

## 2021-02-21 DIAGNOSIS — R7303 Prediabetes: Secondary | ICD-10-CM | POA: Diagnosis not present

## 2021-02-21 DIAGNOSIS — Z0000 Encounter for general adult medical examination without abnormal findings: Secondary | ICD-10-CM | POA: Diagnosis not present

## 2021-02-21 DIAGNOSIS — F430 Acute stress reaction: Secondary | ICD-10-CM | POA: Diagnosis not present

## 2021-02-21 DIAGNOSIS — Z1159 Encounter for screening for other viral diseases: Secondary | ICD-10-CM | POA: Diagnosis not present

## 2021-02-21 DIAGNOSIS — E782 Mixed hyperlipidemia: Secondary | ICD-10-CM | POA: Diagnosis not present

## 2021-02-21 DIAGNOSIS — R059 Cough, unspecified: Secondary | ICD-10-CM | POA: Diagnosis not present

## 2021-02-21 DIAGNOSIS — R0981 Nasal congestion: Secondary | ICD-10-CM | POA: Diagnosis not present

## 2021-02-21 DIAGNOSIS — I10 Essential (primary) hypertension: Secondary | ICD-10-CM | POA: Diagnosis not present

## 2021-02-21 DIAGNOSIS — J019 Acute sinusitis, unspecified: Secondary | ICD-10-CM | POA: Diagnosis not present

## 2021-02-26 DIAGNOSIS — Z0000 Encounter for general adult medical examination without abnormal findings: Secondary | ICD-10-CM | POA: Diagnosis not present

## 2021-02-26 DIAGNOSIS — Z79899 Other long term (current) drug therapy: Secondary | ICD-10-CM | POA: Diagnosis not present

## 2021-02-26 DIAGNOSIS — F430 Acute stress reaction: Secondary | ICD-10-CM | POA: Diagnosis not present

## 2021-02-26 DIAGNOSIS — Z833 Family history of diabetes mellitus: Secondary | ICD-10-CM | POA: Diagnosis not present

## 2021-02-26 DIAGNOSIS — E559 Vitamin D deficiency, unspecified: Secondary | ICD-10-CM | POA: Diagnosis not present

## 2021-02-26 DIAGNOSIS — I10 Essential (primary) hypertension: Secondary | ICD-10-CM | POA: Diagnosis not present

## 2021-02-26 DIAGNOSIS — E782 Mixed hyperlipidemia: Secondary | ICD-10-CM | POA: Diagnosis not present

## 2021-03-01 DIAGNOSIS — K219 Gastro-esophageal reflux disease without esophagitis: Secondary | ICD-10-CM | POA: Diagnosis not present

## 2021-03-01 DIAGNOSIS — I10 Essential (primary) hypertension: Secondary | ICD-10-CM | POA: Diagnosis not present

## 2021-03-01 DIAGNOSIS — M810 Age-related osteoporosis without current pathological fracture: Secondary | ICD-10-CM | POA: Diagnosis not present

## 2021-03-01 DIAGNOSIS — E782 Mixed hyperlipidemia: Secondary | ICD-10-CM | POA: Diagnosis not present

## 2021-03-20 DIAGNOSIS — L723 Sebaceous cyst: Secondary | ICD-10-CM | POA: Diagnosis not present

## 2021-03-21 DIAGNOSIS — G4733 Obstructive sleep apnea (adult) (pediatric): Secondary | ICD-10-CM | POA: Diagnosis not present

## 2021-04-02 DIAGNOSIS — G4733 Obstructive sleep apnea (adult) (pediatric): Secondary | ICD-10-CM | POA: Diagnosis not present

## 2021-04-10 DIAGNOSIS — L659 Nonscarring hair loss, unspecified: Secondary | ICD-10-CM | POA: Diagnosis not present

## 2021-04-10 DIAGNOSIS — R7303 Prediabetes: Secondary | ICD-10-CM | POA: Diagnosis not present

## 2021-04-10 DIAGNOSIS — E890 Postprocedural hypothyroidism: Secondary | ICD-10-CM | POA: Diagnosis not present

## 2021-04-11 ENCOUNTER — Ambulatory Visit (INDEPENDENT_AMBULATORY_CARE_PROVIDER_SITE_OTHER): Payer: PPO | Admitting: Neurology

## 2021-04-11 DIAGNOSIS — R5383 Other fatigue: Secondary | ICD-10-CM

## 2021-04-11 DIAGNOSIS — G4731 Primary central sleep apnea: Secondary | ICD-10-CM

## 2021-04-11 DIAGNOSIS — I773 Arterial fibromuscular dysplasia: Secondary | ICD-10-CM

## 2021-04-11 DIAGNOSIS — G4700 Insomnia, unspecified: Secondary | ICD-10-CM

## 2021-04-11 DIAGNOSIS — G4733 Obstructive sleep apnea (adult) (pediatric): Secondary | ICD-10-CM

## 2021-04-17 DIAGNOSIS — L659 Nonscarring hair loss, unspecified: Secondary | ICD-10-CM | POA: Diagnosis not present

## 2021-04-17 DIAGNOSIS — Z6826 Body mass index (BMI) 26.0-26.9, adult: Secondary | ICD-10-CM | POA: Diagnosis not present

## 2021-04-17 DIAGNOSIS — C73 Malignant neoplasm of thyroid gland: Secondary | ICD-10-CM | POA: Diagnosis not present

## 2021-04-17 DIAGNOSIS — E890 Postprocedural hypothyroidism: Secondary | ICD-10-CM | POA: Diagnosis not present

## 2021-04-17 DIAGNOSIS — R7303 Prediabetes: Secondary | ICD-10-CM | POA: Diagnosis not present

## 2021-04-17 DIAGNOSIS — M810 Age-related osteoporosis without current pathological fracture: Secondary | ICD-10-CM | POA: Diagnosis not present

## 2021-04-19 NOTE — Progress Notes (Signed)
[facility] TEST REPORT ( by Watch PAT)   STUDY DATA:  04-19-21  DOB:  1952-02-15[name] MRN: [id number]    ORDERING CLINICIAN: Larey Seat, MD  REFERRING CLINICIAN:    CLINICAL INFORMATION/HISTORY: Patient underwent a sleep study on 11-15-2019 which diagnosed her with moderate to severe obstructive sleep apnea and an AHI of 30.7/h and in rem sleep and exacerbation of the AHI to 49.7/h.  Mild sleep hypoxia was recorded and normal heart rate variability. 70 year-old.  02-13-2021: RV patient with  OSA on CPAP- and poor compliance data- AHI is very good, 3.3/h , she takes her CPAP off in her sleep- but she noted better sleep when the CPAP was worn.  90% of days but only 37% of hours. She snores and is now aware- had been sleeping with her daughter in one hotel room when not using CPAP, and the family was witnessing snoring and apnea, not central.  She feels her HT was not a good representation of her sleep and would like to repeat. I am happy to order a repeat HST for her. The patient reports that her snoring was controlled on the CPAP her overall fatigue has been reduced yet she may have some residual central apnea.   Epworth sleepiness score: 5/24.  The fatigue severity score was endorsed at 32 out of 63 points in our last visit this was more elevated at 52 out of 63 points.   BMI: 26.8 kg/m Neck Circumference: 14"   Sleep Summary:   Total Recording Time (hours, min): The total recording time of this home sleep test was 9 hours and 3 minutes of which 8 hours and 60 minutes were calculated to be total sleep time.  The percentage of REM sleep was 16.7% of sleep time.                              Respiratory Indices:   Calculated pAHI (per hour): AHI was 30.4/h in non-REM sleep 26.8 and in REM sleep 48.3/h.  There is a clear exacerbation with REM sleep.  A positional dependence was not seen as a supine AHI was 13 and the nonsupine AHI 31.4/h of sleep.  The  patient spent sufficient time in various sleep positions to allow this comparison.  Snoring was moderate with a mean volume of 41 dB                                                                   Oxygen Saturation Statistics:   O2 Saturation Range (%): Between a nadir of 80% oxygen saturation and a maximum saturation of 98% with a mean oxygen saturation of 93%                                     O2 Saturation (minutes) <89%: 3.8 minutes the equivalent of 0.8% of total sleep time.          Pulse Rate Statistics:      Pulse Range:    Between 42 and 89 bpm with a mean heart rate of 60 bpm.  Please note  that cardiac rhythm cannot be determined by this home sleep test device.         IMPRESSION:  This HST confirms the presence of severe and obstructive sleep apnea without hypoxia.  There was no tachycardia noted, no prolonged bradycardia was seen either.  By degree of apnea CPAP therapy would be the best and most efficacious.  If the patient is unable to tolerate CPAP she may have a reduction in her non-REM sleep apnea by using an inspire device or a dental device-  but this is less likely to help with her REM sleep apnea.   So overall her apnea index may remain around 10 to 12/h if only non-REM sleep apnea would be eliminated.   RECOMMENDATION: My first choice is to continue CPAP by autotitration device and using a mask of patient's choice. Auto-setting 6-14 cm water , 2 cm EPR and heated humidification.     INTERPRETING PHYSICIAN:   Larey Seat, MD   Medical Director of [facility] at [facility].

## 2021-04-24 DIAGNOSIS — G4733 Obstructive sleep apnea (adult) (pediatric): Secondary | ICD-10-CM | POA: Insufficient documentation

## 2021-04-24 DIAGNOSIS — R5383 Other fatigue: Secondary | ICD-10-CM | POA: Insufficient documentation

## 2021-04-24 DIAGNOSIS — G4700 Insomnia, unspecified: Secondary | ICD-10-CM | POA: Insufficient documentation

## 2021-04-24 DIAGNOSIS — G4731 Primary central sleep apnea: Secondary | ICD-10-CM | POA: Insufficient documentation

## 2021-04-24 NOTE — Addendum Note (Signed)
Addended by: Larey Seat on: 04/24/2021 05:19 PM   Modules accepted: Orders

## 2021-04-24 NOTE — Progress Notes (Signed)
Dentist Dr. Doy Mince needs CC: IMPRESSION:  This HST confirms the presence of severe and obstructive sleep apnea without hypoxia.  There was no tachycardia noted, no prolonged bradycardia was seen either.  By degree of apnea CPAP therapy would be the best and most efficacious.  If the patient is unable to tolerate CPAP she may have a reduction in her non-REM sleep apnea by using an inspire device or a dental device-  but this is less likely to help with her REM sleep apnea.   So overall her apnea index may remain around 10 to 12/h if only non-REM sleep apnea would be eliminated.  RECOMMENDATION: My first choice is to continue CPAP by autotitration device and using a mask of patient's choice. Auto-setting 6-14 cm water , 2 cm EPR and heated humidification.    INTERPRETING PHYSICIAN:   Larey Seat, MD  ,

## 2021-04-24 NOTE — Procedures (Signed)
[facility] [place] TEST REPORT ( by Watch PAT)   STUDY DATA:  04-19-21  DOB:  12/22/1952[name] MRN: [id number]    ORDERING CLINICIAN: Larey Seat, MD  REFERRING CLINICIAN:    CLINICAL INFORMATION/HISTORY: Patient underwent a sleep study on 11-15-2019 which diagnosed her with moderate to severe obstructive sleep apnea and an AHI of 30.7/h and in rem sleep and exacerbation of the AHI to 49.7/h.  Mild sleep hypoxia was recorded and normal heart rate variability. 70 year-old.  02-13-2021: RV patient with  OSA on CPAP- and poor compliance data- AHI is very good, 3.3/h , she takes her CPAP off in her sleep- but she noted better sleep when the CPAP was worn.  90% of days but only 37% of hours. She snores and is now aware- had been sleeping with her daughter in one hotel room when not using CPAP, and the family was witnessing snoring and apnea, not central.  She feels her HT was not a good representation of her sleep and would like to repeat. I am happy to order a repeat HST for her. The patient reports that her snoring was controlled on the CPAP her overall fatigue has been reduced yet she may have some residual central apnea.   Epworth sleepiness score: 5/24.  The fatigue severity score was endorsed at 32 out of 63 points in our last visit this was more elevated at 52 out of 63 points.   BMI: 26.8 kg/m Neck Circumference: 14"   Sleep Summary:   Total Recording Time (hours, min): The total recording time of this home sleep test was 9 hours and 3 minutes of which 8 hours and 60 minutes were calculated to be total sleep time.  The percentage of REM sleep was 16.7% of sleep time.                              Respiratory Indices:   Calculated pAHI (per hour): AHI was 30.4/h in non-REM sleep 26.8 and in REM sleep 48.3/h.  There is a clear exacerbation with REM sleep.  A positional dependence was not seen as a supine AHI was 13 and the nonsupine AHI 31.4/h of sleep.  The  patient spent sufficient time in various sleep positions to allow this comparison.  Snoring was moderate with a mean volume of 41 dB                                                                   Oxygen Saturation Statistics:   O2 Saturation Range (%): Between a nadir of 80% oxygen saturation and a maximum saturation of 98% with a mean oxygen saturation of 93%                                     O2 Saturation (minutes) <89%: 3.8 minutes the equivalent of 0.8% of total sleep time.          Pulse Rate Statistics:      Pulse Range:    Between 42 and 89 bpm with a mean heart rate of 60 bpm.  Please note that cardiac  rhythm cannot be determined by this home sleep test device.         IMPRESSION:  This HST confirms the presence of severe and obstructive sleep apnea without hypoxia.  There was no tachycardia noted, no prolonged bradycardia was seen either.  By degree of apnea CPAP therapy would be the best and most efficacious.  If the patient is unable to tolerate CPAP she may have a reduction in her non-REM sleep apnea by using an inspire device or a dental device-  but this is less likely to help with her REM sleep apnea.   So overall her apnea index may remain around 10 to 12/h if only non-REM sleep apnea would be eliminated.   RECOMMENDATION: My first choice is to continue CPAP by autotitration device and using a mask of patient's choice. Auto-setting 6-14 cm water , 2 cm EPR and heated humidification.     INTERPRETING PHYSICIAN:   Larey Seat, MD   Medical Director of [facility] at [facility].

## 2021-04-25 ENCOUNTER — Telehealth: Payer: Self-pay

## 2021-04-25 NOTE — Telephone Encounter (Signed)
-----   Message from Larey Seat, MD sent at 04/24/2021  5:19 PM EDT ----- Dentist Dr. Doy Mince needs CC: IMPRESSION:  This HST confirms the presence of severe and obstructive sleep apnea without hypoxia.  There was no tachycardia noted, no prolonged bradycardia was seen either.  By degree of apnea CPAP therapy would be the best and most efficacious.  If the patient is unable to tolerate CPAP she may have a reduction in her non-REM sleep apnea by using an inspire device or a dental device-  but this is less likely to help with her REM sleep apnea.   So overall her apnea index may remain around 10 to 12/h if only non-REM sleep apnea would be eliminated.  RECOMMENDATION: My first choice is to continue CPAP by autotitration device and using a mask of patient's choice. Auto-setting 6-14 cm water , 2 cm EPR and heated humidification.    INTERPRETING PHYSICIAN:   Larey Seat, MD  ,

## 2021-04-25 NOTE — Telephone Encounter (Signed)
I called patient.  I discussed her sleep study results.  Patient is already on CPAP therapy.  I will send her updated order to [facility].  Patient will keep her follow-up as scheduled for November.  Patient verbalized understanding of results.

## 2021-05-03 DIAGNOSIS — L821 Other seborrheic keratosis: Secondary | ICD-10-CM | POA: Diagnosis not present

## 2021-05-03 DIAGNOSIS — L723 Sebaceous cyst: Secondary | ICD-10-CM | POA: Diagnosis not present

## 2021-05-30 DIAGNOSIS — K219 Gastro-esophageal reflux disease without esophagitis: Secondary | ICD-10-CM | POA: Diagnosis not present

## 2021-05-30 DIAGNOSIS — M810 Age-related osteoporosis without current pathological fracture: Secondary | ICD-10-CM | POA: Diagnosis not present

## 2021-05-30 DIAGNOSIS — I10 Essential (primary) hypertension: Secondary | ICD-10-CM | POA: Diagnosis not present

## 2021-05-30 DIAGNOSIS — E782 Mixed hyperlipidemia: Secondary | ICD-10-CM | POA: Diagnosis not present

## 2021-07-09 ENCOUNTER — Encounter: Payer: Self-pay | Admitting: Family Medicine

## 2021-07-09 ENCOUNTER — Other Ambulatory Visit: Payer: Self-pay

## 2021-07-09 ENCOUNTER — Ambulatory Visit: Payer: PPO | Admitting: Family Medicine

## 2021-07-09 VITALS — BP 117/70 | HR 75 | Ht 61.5 in | Wt 145.0 lb

## 2021-07-09 DIAGNOSIS — G4700 Insomnia, unspecified: Secondary | ICD-10-CM

## 2021-07-09 DIAGNOSIS — G4733 Obstructive sleep apnea (adult) (pediatric): Secondary | ICD-10-CM | POA: Diagnosis not present

## 2021-07-09 DIAGNOSIS — Z9989 Dependence on other enabling machines and devices: Secondary | ICD-10-CM

## 2021-07-09 NOTE — Progress Notes (Signed)
PATIENT: Alison [name] DOB: [birth year]  REASON FOR VISIT: follow up HISTORY FROM: patient  Chief Complaint  Patient presents with   Follow-up    EMG room 3, alone. Cpap f/u. No air leaks. States mask falls off in middle of the night. Has worn at most 6 hours.      HISTORY OF PRESENT ILLNESS:  07/09/21 ALL:  Alison [name] is a 70 y.o. female here today for follow up for OSA on CPAP. She did not feel original sleep study 11/2019 demonstrated an accurate picture of her sleep disorder. Repeat HST 04/11/2021 confirmed moderate to severe OSA with total AHI 30.4/hr, REM AHI 48.3/hr ans O2 nadir 80%. She was advised to continue CPAP. She reports that she does well with daily compliance. She states that her nasal pillow mask is comfortable but she is concerned that it does not stay on her head night.      HISTORY: (copied from Dr Dohmeier's previous note)  RV 02-13-2021 CD: I have the pleasure t see Mrs. Agent today, a meanwhile 70 year-old.  With OSA on CPAP- and poor compliance data- AHI is very good, 3.3/h , she takes her CPAP off in her sleep- but she noted better sleep when the CPAP was worn.  90% of days but only 37% of hours. She snores and is now aware- had been sleeping with her daughter in one hotel room when not using CPAP, and the family was witnessing snoring and apnea, not central.  She feels her HT was not a good representation of her sleep and would like to repeat. I am hapy to order a repeat HST for her.    RV 02-02-2020: Patient underwent a sleep study on 11-15-2019 which diagnosed her with moderate to severe obstructive sleep apnea and an AHI of 30.7/h and in rem sleep and exacerbation of the AHI to 49.7/h.  Mild sleep hypoxia was recorded and normal heart rate variability.  Louder snoring was present as indicated by RDI the patient started on CPAP between 5 and 18 cmH2O with 3 cm EPR, she reports today that sometimes she just loses the CPAP she is using a  DreamWear nasal pillow and apparently sometimes wakes up and finds the appliance next to her.  Her average user time however has been 4 hours 45 minutes and her compliance has been 29 out of 30 days is 57% of more than 4 hours of consecutive use.  Minimum setting is as indicated 5 maximum setting 18 cm water pressure there is a 3 cm EPR and her residual AHI is 3.7.  The 95th percentile pressure of CPAP is 7.6 cmH2O so I think we can lower limit her maximum pressure offer from 18-12 and therefore prevent excessive airflow of the feeling that air is blowing out him too high level.  The leakage has actually been well controlled usage has significantly improved over the last month and overall her AHI and most nights is under 3.  I would continue with the current settings but I reduce the upper limit of pressure from 18 to 12 cm water.   The patient reports a good response , she feels better rested .    REVIEW OF SYSTEMS: Out of a complete 14 system review of symptoms, the patient complains only of the following symptoms, none and all other reviewed systems are negative.  ESS: 3  ALLERGIES: Allergies  Allergen Reactions   Shellfish Allergy Anaphylaxis and Hives   Betadine [Povidone Iodine] Other (See  Comments)    Unspecified reaction to topical betadine used for skin prep.    Penicillins Hives and Swelling    Did it involve swelling of the face/tongue/throat, SOB, or low BP? Unknown Did it involve sudden or severe rash/hives, skin peeling, or any reaction on the inside of your mouth or nose? Unknown Did you need to seek medical attention at a hospital or doctor's office? Unknown When did it last happen?      childhood allergy If all above answers are "NO", may proceed with cephalosporin use.    Tetracycline Hcl Hives   Tramadol Nausea And Vomiting    HOME MEDICATIONS: Outpatient Medications Prior to Visit  Medication Sig Dispense Refill   amLODipine-benazepril (LOTREL) 5-10 MG per capsule  Take 1 capsule by mouth every other day.      Biotin 1000 MCG tablet Take 1,000 mcg by mouth every other day.      buPROPion (WELLBUTRIN XL) 300 MG 24 hr tablet Take 300 mg by mouth daily.     Cholecalciferol (VITAMIN D-3) 25 MCG (1000 UT) CAPS Take 1,000 Units by mouth daily.      Coenzyme Q10 (CO Q-10) 200 MG CAPS Take 200 mg by mouth daily.      levothyroxine (SYNTHROID) 112 MCG tablet Take 112 mcg by mouth daily before breakfast.     Multiple Vitamins-Minerals (ONE-A-DAY WOMENS 50 PLUS PO) Take 1 tablet by mouth daily.     PROLIA 60 MG/ML SOSY injection Inject 60 mg into the muscle every 6 (six) months.  1   simvastatin (ZOCOR) 20 MG tablet Take 20 mg by mouth daily.     traZODone (DESYREL) 50 MG tablet Take 1 tablet (50 mg total) by mouth at bedtime as needed for sleep. 30 tablet 5   No facility-administered medications prior to visit.    PAST MEDICAL HISTORY: Past Medical History:  Diagnosis Date   Arthritis    Chest pain    Depression    Fibromuscular dysplasia (HCC)    GERD (gastroesophageal reflux disease)    Hypercholesteremia    Hypertension    on meds   Hyperthyroidism    Osteoporosis     PAST SURGICAL HISTORY: Past Surgical History:  Procedure Laterality Date   ABDOMINAL HYSTERECTOMY     APPENDECTOMY     1969   CARDIAC CATHETERIZATION     normal cath , chest pains due to stress   [place]   colonscopy  2009   LUMBAR LAMINECTOMY/DECOMPRESSION MICRODISCECTOMY  12/23/2011   Procedure: LUMBAR LAMINECTOMY/DECOMPRESSION MICRODISCECTOMY 1 LEVEL;  Surgeon: Hosie Spangle, MD;  Location: [facility];  Service: Neurosurgery;  Laterality: Right;  RIGHT Lumbar two- three laminotomy and microdiskectomy   ORIF WRIST FRACTURE  2006   THYROIDECTOMY N/A 01/28/2019   Procedure: TOTAL THYROIDECTOMY;  Surgeon: Armandina Gemma, MD;  Location: [facility];  Service: General;  Laterality: N/A;   UPPER GI ENDOSCOPY      FAMILY HISTORY: Family History  Problem Relation  Age of Onset   Hyperlipidemia Mother    Brain cancer Mother    Heart disease Father        Vavle replacement    Hyperlipidemia Father    Diabetes Father    Peripheral vascular disease Father    Atrial fibrillation Father    Osteoarthritis Paternal Aunt    Lupus Paternal Uncle    Hypothyroidism Sister    Anesthesia problems Neg Hx    Hypotension Neg Hx    Malignant hyperthermia  Neg Hx    Pseudochol deficiency Neg Hx     SOCIAL HISTORY: Social History   Socioeconomic History   Marital status: Married    Spouse name: Not on file   Number of children: 2   Years of education: Not on file   Highest education level: Not on file  Occupational History   Occupation: Retired    Fish farm manager: [employer]  Tobacco Use   Smoking status: Never   Smokeless tobacco: Never  Substance and Sexual Activity   Alcohol use: Yes    Comment: social   Drug use: No   Sexual activity: Yes    Birth control/protection: None  Other Topics Concern   Not on file  Social History Narrative   Not on file   Social Determinants of Health   Financial Resource Strain: Not on file  Food Insecurity: Not on file  Transportation Needs: Not on file  Physical Activity: Not on file  Stress: Not on file  Social Connections: Not on file  Intimate Partner Violence: Not on file     PHYSICAL EXAM  Vitals:   07/09/21 1128  BP: 117/70  Pulse: 75  SpO2: 98%  Weight: 145 lb (65.8 kg)  Height: 5' 1.5" (1.562 m)   Body mass index is 26.95 kg/m.  Generalized: Well developed, in no acute distress  Cardiology: normal rate and rhythm, no murmur noted Respiratory: clear to auscultation bilaterally  Neurological examination  Mentation: Alert oriented to time, place, history taking. Follows all commands speech and language fluent Cranial nerve II-XII: Pupils were equal round reactive to light. Extraocular movements were full, visual field were full  Motor: The motor testing reveals 5 over 5 strength of all 4  extremities. Good symmetric motor tone is noted throughout.  Gait and station: Gait is normal.    DIAGNOSTIC DATA (LABS, IMAGING, TESTING) - I reviewed patient records, labs, notes, testing and imaging myself where available.  No flowsheet data found.   Lab Results  Component Value Date   WBC 7.4 01/25/2019   HGB 14.0 01/25/2019   HCT 44.2 01/25/2019   MCV 89.5 01/25/2019   PLT 231 01/25/2019      Component Value Date/Time   NA 134 (L) 01/29/2019 0335   K 3.9 01/29/2019 0335   CL 102 01/29/2019 0335   CO2 25 01/29/2019 0335   GLUCOSE 180 (H) 01/29/2019 0335   BUN 14 01/29/2019 0335   CREATININE 0.67 01/29/2019 0335   CALCIUM 8.0 (L) 01/29/2019 0335   PROT 7.0 08/10/2016 1246   ALBUMIN 3.8 08/10/2016 1246   AST 27 08/10/2016 1246   ALT 34 08/10/2016 1246   ALKPHOS 80 08/10/2016 1246   BILITOT 0.4 08/10/2016 1246   GFRNONAA >60 01/29/2019 0335   GFRAA >60 01/29/2019 0335   No results found for: CHOL, HDL, LDLCALC, LDLDIRECT, TRIG, CHOLHDL No results found for: HGBA1C No results found for: VITAMINB12 No results found for: TSH   ASSESSMENT AND PLAN 70 y.o. year old female  has a past medical history of Arthritis, Chest pain, Depression, Fibromuscular dysplasia ([place]), GERD (gastroesophageal reflux disease), Hypercholesteremia, Hypertension, Hyperthyroidism, and Osteoporosis. here with     ICD-10-CM   1. OSA on CPAP  G47.33 For home use only DME continuous positive airway pressure (CPAP)   Z99.89     2. Insomnia, unspecified type  G47.00         Lars Pinks Stamas is doing well on CPAP therapy. Compliance report reveals excellent daily but sub optimal 4 hour  compliance. She was encouraged to continue using CPAP nightly and for greater than 4 hours each night. Risks of untreated sleep apnea review and education materials provided. I will send orders for mask refitting. She will continue trazodone 50mg  daily at bedtime. Healthy lifestyle habits encouraged. She will  follow up in 6 months, sooner if needed. She verbalizes understanding and agreement with this plan.    Orders Placed This Encounter  Procedures   For home use only DME continuous positive airway pressure (CPAP)    Mask refitting    Order Specific Question:   Length of Need    Answer:   Lifetime    Order Specific Question:   Patient has OSA or probable OSA    Answer:   Yes    Order Specific Question:   Is the patient currently using CPAP in the home    Answer:   Yes    Order Specific Question:   Settings    Answer:   Other see comments    Order Specific Question:   CPAP supplies needed    Answer:   Mask, headgear, cushions, filters, heated tubing and water chamber      No orders of the defined types were placed in this encounter.     Debbora Presto, FNP-C 07/09/2021, 11:58 AM [facility] [address] [place], [place] [address] [phone]

## 2021-07-09 NOTE — Patient Instructions (Addendum)
Please continue using your CPAP regularly. While your insurance requires that you use CPAP at least 4 hours each night on 70% of the nights, I recommend, that you not skip any nights and use it throughout the night if you can. Getting used to CPAP and staying with the treatment long term does take time and patience and discipline. Untreated obstructive sleep apnea when it is moderate to severe can have an adverse impact on cardiovascular health and raise her risk for heart disease, arrhythmias, hypertension, congestive heart failure, stroke and diabetes. Untreated obstructive sleep apnea causes sleep disruption, nonrestorative sleep, and sleep deprivation. This can have an impact on your day to day functioning and cause daytime sleepiness and impairment of cognitive function, memory loss, mood disturbance, and problems focussing. Using CPAP regularly can improve these symptoms.   Please continue working on 4 hour compliance. Call [facility] for mask refitting. Continue trazodone at bedtime.   Follow up in 4 months

## 2021-07-09 NOTE — Progress Notes (Signed)
CM sent to [facility]

## 2021-07-26 ENCOUNTER — Other Ambulatory Visit (HOSPITAL_COMMUNITY): Payer: Self-pay | Admitting: Gastroenterology

## 2021-07-26 ENCOUNTER — Other Ambulatory Visit: Payer: Self-pay | Admitting: Gastroenterology

## 2021-07-26 DIAGNOSIS — R1010 Upper abdominal pain, unspecified: Secondary | ICD-10-CM | POA: Diagnosis not present

## 2021-07-26 DIAGNOSIS — K31A0 Gastric intestinal metaplasia, unspecified: Secondary | ICD-10-CM | POA: Diagnosis not present

## 2021-07-26 DIAGNOSIS — K222 Esophageal obstruction: Secondary | ICD-10-CM | POA: Diagnosis not present

## 2021-07-26 DIAGNOSIS — K219 Gastro-esophageal reflux disease without esophagitis: Secondary | ICD-10-CM | POA: Diagnosis not present

## 2021-07-30 ENCOUNTER — Ambulatory Visit (HOSPITAL_COMMUNITY)
Admission: RE | Admit: 2021-07-30 | Discharge: 2021-07-30 | Disposition: A | Discharge status: Home / Self Care | Payer: PPO | Source: Ambulatory Visit | Attending: Gastroenterology | Admitting: Gastroenterology

## 2021-07-30 ENCOUNTER — Other Ambulatory Visit (HOSPITAL_COMMUNITY): Payer: Self-pay | Admitting: Gastroenterology

## 2021-07-30 ENCOUNTER — Other Ambulatory Visit: Payer: Self-pay

## 2021-07-30 DIAGNOSIS — R1010 Upper abdominal pain, unspecified: Secondary | ICD-10-CM | POA: Insufficient documentation

## 2021-07-30 DIAGNOSIS — K219 Gastro-esophageal reflux disease without esophagitis: Secondary | ICD-10-CM | POA: Diagnosis not present

## 2021-07-30 DIAGNOSIS — K224 Dyskinesia of esophagus: Secondary | ICD-10-CM | POA: Diagnosis not present

## 2021-07-30 DIAGNOSIS — K449 Diaphragmatic hernia without obstruction or gangrene: Secondary | ICD-10-CM | POA: Diagnosis not present

## 2021-08-08 DIAGNOSIS — G4733 Obstructive sleep apnea (adult) (pediatric): Secondary | ICD-10-CM | POA: Diagnosis not present

## 2021-08-23 DIAGNOSIS — L299 Pruritus, unspecified: Secondary | ICD-10-CM | POA: Diagnosis not present

## 2021-09-14 DIAGNOSIS — M810 Age-related osteoporosis without current pathological fracture: Secondary | ICD-10-CM | POA: Diagnosis not present

## 2021-09-20 ENCOUNTER — Other Ambulatory Visit: Payer: Self-pay | Admitting: Obstetrics & Gynecology

## 2021-09-20 DIAGNOSIS — M810 Age-related osteoporosis without current pathological fracture: Secondary | ICD-10-CM | POA: Diagnosis not present

## 2021-09-20 DIAGNOSIS — Z01411 Encounter for gynecological examination (general) (routine) with abnormal findings: Secondary | ICD-10-CM | POA: Diagnosis not present

## 2021-09-20 DIAGNOSIS — Z01419 Encounter for gynecological examination (general) (routine) without abnormal findings: Secondary | ICD-10-CM | POA: Diagnosis not present

## 2021-09-20 DIAGNOSIS — Z6826 Body mass index (BMI) 26.0-26.9, adult: Secondary | ICD-10-CM | POA: Diagnosis not present

## 2021-09-20 DIAGNOSIS — Z124 Encounter for screening for malignant neoplasm of cervix: Secondary | ICD-10-CM | POA: Diagnosis not present

## 2021-10-03 ENCOUNTER — Telehealth: Payer: Self-pay | Admitting: Family Medicine

## 2021-10-03 DIAGNOSIS — L820 Inflamed seborrheic keratosis: Secondary | ICD-10-CM | POA: Diagnosis not present

## 2021-10-03 DIAGNOSIS — L720 Epidermal cyst: Secondary | ICD-10-CM | POA: Diagnosis not present

## 2021-10-03 DIAGNOSIS — Z23 Encounter for immunization: Secondary | ICD-10-CM | POA: Diagnosis not present

## 2021-10-03 NOTE — Telephone Encounter (Signed)
Pt states within the last few weeks she is having to take her CPAP off in the middle of the night, pt states it goes from 5-14 and it is possibly too high.  Please call pt.

## 2021-10-03 NOTE — Telephone Encounter (Signed)
Called the patient back. She was concerned that her pressure on her machine was going up to high and that was what could be causing her to unknowingly take it off during the night time.  In reviewing the data/DL her numbers appeared great. I also advised that she has a auto machine and although her maximum pressure is set at 14 cm water pressure she tends to average for the most part around a pressure 8 cm water pressure. She was glad to hear that. She really wants to use this machine and she starts off doing well but at some point in the night takes it off. She has tried the 25 mg trazodone to see if that would help relax her enough and she still didn't sleep well. (If she takes the 50 mg dose she is groggy the next day). We discussed lowering the humidification level to 4 from 5 to see if that would help. We also discussed her trying a benadryl tablet at bedtime to see if that helps. The melatonin didn't help either. She is asking what other things can be tried to help her not take it off in the night.

## 2021-10-09 ENCOUNTER — Encounter: Payer: Self-pay | Admitting: Neurology

## 2021-10-10 DIAGNOSIS — Z5189 Encounter for other specified aftercare: Secondary | ICD-10-CM | POA: Diagnosis not present

## 2021-10-10 DIAGNOSIS — L821 Other seborrheic keratosis: Secondary | ICD-10-CM | POA: Diagnosis not present

## 2021-10-10 DIAGNOSIS — L720 Epidermal cyst: Secondary | ICD-10-CM | POA: Diagnosis not present

## 2021-11-05 NOTE — Patient Instructions (Addendum)
Please continue using your CPAP regularly. While your insurance requires that you use CPAP at least 4 hours each night on 70% of the nights, I recommend, that you not skip any nights and use it throughout the night if you can. Getting used to CPAP and staying with the treatment long term does take time and patience and discipline. Untreated obstructive sleep apnea when it is moderate to severe can have an adverse impact on cardiovascular health and raise her risk for heart disease, arrhythmias, hypertension, congestive heart failure, stroke and diabetes. Untreated obstructive sleep apnea causes sleep disruption, nonrestorative sleep, and sleep deprivation. This can have an impact on your day to day functioning and cause daytime sleepiness and impairment of cognitive function, memory loss, mood disturbance, and problems focussing. Using CPAP regularly can improve these symptoms. ? ? ?Start trazodone '25mg'$  (1/2 tablet) daily at bedtime for the next 4-6 weeks. If well tolerated, you can increase to '50mg'$  (full tablet) but do not exceed '50mg'$  daily.  ? ?Follow up with me in 6 months  ?

## 2021-11-05 NOTE — Progress Notes (Signed)
? ?PATIENT: Alison [name] ?DOB: 1951/05/30 ? ?REASON FOR VISIT: follow up ?HISTORY FROM: patient ? ?Virtual Visit via Telephone Note ? ?I connected with Milessa Hogan Charlson on 11/06/21 at  8:00 AM EDT by telephone and verified that I am speaking with the correct person using two identifiers. ?  ?I discussed the limitations, risks, security and privacy concerns of performing an evaluation and management service by telephone and the availability of in person appointments. I also discussed with the patient that there may be a patient responsible charge related to this service. The patient expressed understanding and agreed to proceed. ? ? ?History of Present Illness: ? ?11/06/21 ALL ([place]): ?Alison [name] is a 71 y.o. female here today for follow up for OSA on CPAP. She has continued working on improving compliance. She is using CPAP most nights. She admits that there are continued concerns of waking up without her mask in place. She feels that she takes it off at some point during the night while asleep. She does not significant improvement in how she feels when using CPAP. She continues to have difficulty staying asleep. She has been taing 1/2 tablet of benedryl that seems to help some. She has not continued trazodone due to concerns of polypharmacy. She has been taking Wellbutrin and was recently advised to consider adding escitalpram.  ? ? ? ? ?07/09/21 ALL (Office):  ?Alison [name] is a 71 y.o. female here today for follow up for OSA on CPAP. She did not feel original sleep study 11/2019 demonstrated an accurate picture of her sleep disorder. Repeat HST 04/11/2021 confirmed moderate to severe OSA with total AHI 30.4/hr, REM AHI 48.3/hr ans O2 nadir 80%. She was advised to continue CPAP. She reports that she does well with daily compliance. She states that her nasal pillow mask is comfortable but she is concerned that it does not stay on her head night.  ?  ?   ? ? ?Observations/Objective: ? ?Generalized: Well developed, in no acute distress  ?Mentation: Alert oriented to time, place, history taking. Follows all commands speech and language fluent ? ? ?Assessment and Plan: ? ?71 y.o. year old female  has a past medical history of Arthritis, Chest pain, Depression, Fibromuscular dysplasia ([place]), GERD (gastroesophageal reflux disease), Hypercholesteremia, Hypertension, Hyperthyroidism, and Osteoporosis. here with ? ?  ICD-10-CM   ?1. OSA on CPAP  G47.33   ? Z99.89   ?  ?2. Insomnia, unspecified type  G47.00   ?  ? ? ?Lynia continues to adjust to CPAP therapy. Compliance report reveals improving daily and four hour usage. We have discussed sup optimal four hour data. She will monitor for appropriate mask fit. No leak noted. She was encouraged to continue using CPAP nightly for at least 4 hours. We reviewed benefits and risks of taking trazodone. She does feel this could help with insomnia and still has prescription at home. She is taking Wellbutrin. She was advised to take trazodone '25mg'$  daily for 4-6 weeks. If well tolerated she could increase dose to 50 but I would caution against using more than '50mg'$  daily in combination with Wellbutrin. Sleep hygiene reviewed. Healthy lifestyle habits encouraged. She will follow up in 6 months, sooner if needed.  ? ?No orders of the defined types were placed in this encounter. ? ? ?No orders of the defined types were placed in this encounter. ? ? ? ?Follow Up Instructions: ? ?I discussed the assessment and treatment plan with the patient. The patient was provided an  opportunity to ask questions and all were answered. The patient agreed with the plan and demonstrated an understanding of the instructions. ?  ?The patient was advised to call back or seek an in-person evaluation if the symptoms worsen or if the condition fails to improve as anticipated. ? ?I provided 15 minutes of non-face-to-face time during this encounter. Patient located  at their place of residence during [place] visit. Provider is in the office.  ? ? ?Debbora Presto, NP  ?

## 2021-11-06 ENCOUNTER — Telehealth (INDEPENDENT_AMBULATORY_CARE_PROVIDER_SITE_OTHER): Payer: PPO | Admitting: Family Medicine

## 2021-11-06 ENCOUNTER — Encounter: Payer: Self-pay | Admitting: Family Medicine

## 2021-11-06 DIAGNOSIS — Z9989 Dependence on other enabling machines and devices: Secondary | ICD-10-CM | POA: Diagnosis not present

## 2021-11-06 DIAGNOSIS — G4733 Obstructive sleep apnea (adult) (pediatric): Secondary | ICD-10-CM

## 2021-11-06 DIAGNOSIS — G4700 Insomnia, unspecified: Secondary | ICD-10-CM | POA: Diagnosis not present

## 2021-11-06 NOTE — Progress Notes (Signed)
CM sent to [facility] for new order ?

## 2021-11-12 DIAGNOSIS — R7303 Prediabetes: Secondary | ICD-10-CM | POA: Diagnosis not present

## 2021-11-12 DIAGNOSIS — C73 Malignant neoplasm of thyroid gland: Secondary | ICD-10-CM | POA: Diagnosis not present

## 2021-11-19 DIAGNOSIS — E890 Postprocedural hypothyroidism: Secondary | ICD-10-CM | POA: Diagnosis not present

## 2021-11-19 DIAGNOSIS — R7303 Prediabetes: Secondary | ICD-10-CM | POA: Diagnosis not present

## 2021-11-19 DIAGNOSIS — Z6826 Body mass index (BMI) 26.0-26.9, adult: Secondary | ICD-10-CM | POA: Diagnosis not present

## 2021-11-19 DIAGNOSIS — M810 Age-related osteoporosis without current pathological fracture: Secondary | ICD-10-CM | POA: Diagnosis not present

## 2021-11-19 DIAGNOSIS — L659 Nonscarring hair loss, unspecified: Secondary | ICD-10-CM | POA: Diagnosis not present

## 2021-11-19 DIAGNOSIS — C73 Malignant neoplasm of thyroid gland: Secondary | ICD-10-CM | POA: Diagnosis not present

## 2021-11-27 DIAGNOSIS — L989 Disorder of the skin and subcutaneous tissue, unspecified: Secondary | ICD-10-CM | POA: Diagnosis not present

## 2021-11-27 DIAGNOSIS — L720 Epidermal cyst: Secondary | ICD-10-CM | POA: Diagnosis not present

## 2021-11-27 DIAGNOSIS — Z411 Encounter for cosmetic surgery: Secondary | ICD-10-CM | POA: Diagnosis not present

## 2021-12-05 DIAGNOSIS — R252 Cramp and spasm: Secondary | ICD-10-CM | POA: Diagnosis not present

## 2021-12-05 DIAGNOSIS — I87301 Chronic venous hypertension (idiopathic) without complications of right lower extremity: Secondary | ICD-10-CM | POA: Diagnosis not present

## 2021-12-05 DIAGNOSIS — I10 Essential (primary) hypertension: Secondary | ICD-10-CM | POA: Diagnosis not present

## 2021-12-13 DIAGNOSIS — M940 Chondrocostal junction syndrome [Tietze]: Secondary | ICD-10-CM | POA: Diagnosis not present

## 2021-12-13 DIAGNOSIS — R079 Chest pain, unspecified: Secondary | ICD-10-CM | POA: Diagnosis not present

## 2021-12-31 DIAGNOSIS — Z1231 Encounter for screening mammogram for malignant neoplasm of breast: Secondary | ICD-10-CM | POA: Diagnosis not present

## 2022-03-06 DIAGNOSIS — I10 Essential (primary) hypertension: Secondary | ICD-10-CM | POA: Diagnosis not present

## 2022-03-06 DIAGNOSIS — E782 Mixed hyperlipidemia: Secondary | ICD-10-CM | POA: Diagnosis not present

## 2022-03-06 DIAGNOSIS — E559 Vitamin D deficiency, unspecified: Secondary | ICD-10-CM | POA: Diagnosis not present

## 2022-03-13 ENCOUNTER — Other Ambulatory Visit: Payer: Self-pay | Admitting: Family Medicine

## 2022-03-13 ENCOUNTER — Other Ambulatory Visit (HOSPITAL_COMMUNITY): Payer: Self-pay | Admitting: Family Medicine

## 2022-03-13 DIAGNOSIS — F430 Acute stress reaction: Secondary | ICD-10-CM | POA: Diagnosis not present

## 2022-03-13 DIAGNOSIS — Z6825 Body mass index (BMI) 25.0-25.9, adult: Secondary | ICD-10-CM | POA: Diagnosis not present

## 2022-03-13 DIAGNOSIS — E782 Mixed hyperlipidemia: Secondary | ICD-10-CM

## 2022-03-13 DIAGNOSIS — E559 Vitamin D deficiency, unspecified: Secondary | ICD-10-CM | POA: Diagnosis not present

## 2022-03-13 DIAGNOSIS — I10 Essential (primary) hypertension: Secondary | ICD-10-CM | POA: Diagnosis not present

## 2022-03-13 DIAGNOSIS — Z0000 Encounter for general adult medical examination without abnormal findings: Secondary | ICD-10-CM | POA: Diagnosis not present

## 2022-03-25 DIAGNOSIS — R7303 Prediabetes: Secondary | ICD-10-CM | POA: Diagnosis not present

## 2022-03-25 DIAGNOSIS — E890 Postprocedural hypothyroidism: Secondary | ICD-10-CM | POA: Diagnosis not present

## 2022-03-27 ENCOUNTER — Ambulatory Visit (HOSPITAL_COMMUNITY)
Admission: RE | Admit: 2022-03-27 | Discharge: 2022-03-27 | Disposition: A | Discharge status: Home / Self Care | Payer: PPO | Source: Ambulatory Visit | Attending: Family Medicine | Admitting: Family Medicine

## 2022-03-27 DIAGNOSIS — G4733 Obstructive sleep apnea (adult) (pediatric): Secondary | ICD-10-CM | POA: Diagnosis not present

## 2022-03-27 DIAGNOSIS — E782 Mixed hyperlipidemia: Secondary | ICD-10-CM | POA: Insufficient documentation

## 2022-04-01 DIAGNOSIS — E890 Postprocedural hypothyroidism: Secondary | ICD-10-CM | POA: Diagnosis not present

## 2022-04-01 DIAGNOSIS — R7309 Other abnormal glucose: Secondary | ICD-10-CM | POA: Diagnosis not present

## 2022-04-01 DIAGNOSIS — R7303 Prediabetes: Secondary | ICD-10-CM | POA: Diagnosis not present

## 2022-04-01 DIAGNOSIS — M810 Age-related osteoporosis without current pathological fracture: Secondary | ICD-10-CM | POA: Diagnosis not present

## 2022-04-01 DIAGNOSIS — C73 Malignant neoplasm of thyroid gland: Secondary | ICD-10-CM | POA: Diagnosis not present

## 2022-04-04 ENCOUNTER — Ambulatory Visit
Admission: RE | Admit: 2022-04-04 | Discharge: 2022-04-04 | Disposition: A | Discharge status: Home / Self Care | Payer: PPO | Source: Ambulatory Visit | Attending: Obstetrics & Gynecology | Admitting: Obstetrics & Gynecology

## 2022-04-04 DIAGNOSIS — M810 Age-related osteoporosis without current pathological fracture: Secondary | ICD-10-CM

## 2022-04-04 DIAGNOSIS — Z780 Asymptomatic menopausal state: Secondary | ICD-10-CM | POA: Diagnosis not present

## 2022-04-09 DIAGNOSIS — I83813 Varicose veins of bilateral lower extremities with pain: Secondary | ICD-10-CM | POA: Diagnosis not present

## 2022-04-09 DIAGNOSIS — I10 Essential (primary) hypertension: Secondary | ICD-10-CM | POA: Diagnosis not present

## 2022-04-09 DIAGNOSIS — R252 Cramp and spasm: Secondary | ICD-10-CM | POA: Diagnosis not present

## 2022-04-09 DIAGNOSIS — I781 Nevus, non-neoplastic: Secondary | ICD-10-CM | POA: Diagnosis not present

## 2022-04-09 DIAGNOSIS — I872 Venous insufficiency (chronic) (peripheral): Secondary | ICD-10-CM | POA: Diagnosis not present

## 2022-05-06 DIAGNOSIS — G4733 Obstructive sleep apnea (adult) (pediatric): Secondary | ICD-10-CM | POA: Diagnosis not present

## 2022-05-14 DIAGNOSIS — E890 Postprocedural hypothyroidism: Secondary | ICD-10-CM | POA: Diagnosis not present

## 2022-05-16 NOTE — Progress Notes (Signed)
PATIENT: Alison [name] DOB: 27-May-1951  REASON FOR VISIT: follow up HISTORY FROM: patient  Virtual Visit via Telephone Note  I connected with Alison [name] on 05/21/22 at  8:00 AM EDT by telephone and verified that I am speaking with the correct person using two identifiers.   I discussed the limitations, risks, security and privacy concerns of performing an evaluation and management service by telephone and the availability of in person appointments. I also discussed with the patient that there may be a patient responsible charge related to this service. The patient expressed understanding and agreed to proceed.   History of Present Illness:  05/21/22 ALL (mychart): Alison [name] returns for follow up for OSA on CPAP. She reports doing well. She is adjusting to therapy. She is using CPAP nightly but does continue to have difficulty meeting 4 our usage each night. She reports waking in the morning and has removed mask unknowingly. She does feel better rested when using therapy.   She continues trazodone '50mg'$  QHS on occasion. She feels that taking 1/2 of a Benadryl helps more due to congestion. She is able to go to sleep easily.     11/06/2021 ALL (mychart): Alison [name] is a 71 y.o. female here today for follow up for OSA on CPAP. She has continued working on improving compliance. She is using CPAP most nights. She admits that there are continued concerns of waking up without her mask in place. She feels that she takes it off at some point during the night while asleep. She does not significant improvement in how she feels when using CPAP. She continues to have difficulty staying asleep. She has been taing 1/2 tablet of benedryl that seems to help some. She has not continued trazodone due to concerns of polypharmacy. She has been taking Wellbutrin and was recently advised to consider adding escitalpram.      07/09/21 ALL (Office):  Alison [name] is a 71 y.o.  female here today for follow up for OSA on CPAP. She did not feel original sleep study 11/2019 demonstrated an accurate picture of her sleep disorder. Repeat HST 04/11/2021 confirmed moderate to severe OSA with total AHI 30.4/hr, REM AHI 48.3/hr ans O2 nadir 80%. She was advised to continue CPAP. She reports that she does well with daily compliance. She states that her nasal pillow mask is comfortable but she is concerned that it does not stay on her head night.        Observations/Objective:  Generalized: Well developed, in no acute distress  Mentation: Alert oriented to time, place, history taking. Follows all commands speech and language fluent   Assessment and Plan:  71 y.o. year old female  has a past medical history of Arthritis, Chest pain, Depression, Fibromuscular dysplasia ([place]), GERD (gastroesophageal reflux disease), Hypercholesteremia, Hypertension, Hyperthyroidism, and Osteoporosis. here with    ICD-10-CM   1. OSA on CPAP  G47.33 For home use only DME continuous positive airway pressure (CPAP)    2. Insomnia, unspecified type  G47.00        Alison [name] continues to adjust to CPAP therapy. Compliance report reveals excellent daily and optimal four hour usage. She does note improved sleep quality with therapy. She was encouraged to continue using CPAP nightly for at least 4 hours. She may use trazodone '50mg'$  as needed for insomnia. Sleep hygiene reviewed. Healthy lifestyle habits encouraged. She will follow up in 1 year, sooner if needed.   Orders Placed This Encounter  Procedures   For  home use only DME continuous positive airway pressure (CPAP)    Supplies    Order Specific Question:   Length of Need    Answer:   Lifetime    Order Specific Question:   Patient has OSA or probable OSA    Answer:   Yes    Order Specific Question:   Is the patient currently using CPAP in the home    Answer:   Yes    Order Specific Question:   Settings    Answer:   Other see comments    Order  Specific Question:   CPAP supplies needed    Answer:   Mask, headgear, cushions, filters, heated tubing and water chamber    No orders of the defined types were placed in this encounter.    Follow Up Instructions:  I discussed the assessment and treatment plan with the patient. The patient was provided an opportunity to ask questions and all were answered. The patient agreed with the plan and demonstrated an understanding of the instructions.   The patient was advised to call back or seek an in-person evaluation if the symptoms worsen or if the condition fails to improve as anticipated.  I provided 15 minutes of non-face-to-face time during this encounter. Patient located at their place of residence during [place] visit. Provider is in the office.    Debbora Presto, NP

## 2022-05-16 NOTE — Patient Instructions (Addendum)
Please continue using your CPAP regularly. While your insurance requires that you use CPAP at least 4 hours each night on 70% of the nights, I recommend, that you not skip any nights and use it throughout the night if you can. Getting used to CPAP and staying with the treatment long term does take time and patience and discipline. Untreated obstructive sleep apnea when it is moderate to severe can have an adverse impact on cardiovascular health and raise her risk for heart disease, arrhythmias, hypertension, congestive heart failure, stroke and diabetes. Untreated obstructive sleep apnea causes sleep disruption, nonrestorative sleep, and sleep deprivation. This can have an impact on your day to day functioning and cause daytime sleepiness and impairment of cognitive function, memory loss, mood disturbance, and problems focussing. Using CPAP regularly can improve these symptoms.   You may use trazodone '50mg'$  as needed for insomnia.   Follow up in 1 year

## 2022-05-21 ENCOUNTER — Telehealth (INDEPENDENT_AMBULATORY_CARE_PROVIDER_SITE_OTHER): Payer: PPO | Admitting: Family Medicine

## 2022-05-21 ENCOUNTER — Encounter: Payer: Self-pay | Admitting: Family Medicine

## 2022-05-21 ENCOUNTER — Telehealth: Payer: Self-pay

## 2022-05-21 DIAGNOSIS — G4733 Obstructive sleep apnea (adult) (pediatric): Secondary | ICD-10-CM | POA: Diagnosis not present

## 2022-05-21 DIAGNOSIS — G4700 Insomnia, unspecified: Secondary | ICD-10-CM | POA: Diagnosis not present

## 2022-05-22 DIAGNOSIS — E663 Overweight: Secondary | ICD-10-CM | POA: Diagnosis not present

## 2022-05-22 DIAGNOSIS — F3341 Major depressive disorder, recurrent, in partial remission: Secondary | ICD-10-CM | POA: Diagnosis not present

## 2022-05-22 DIAGNOSIS — G4733 Obstructive sleep apnea (adult) (pediatric): Secondary | ICD-10-CM | POA: Diagnosis not present

## 2022-05-22 DIAGNOSIS — R7303 Prediabetes: Secondary | ICD-10-CM | POA: Diagnosis not present

## 2022-05-22 DIAGNOSIS — E559 Vitamin D deficiency, unspecified: Secondary | ICD-10-CM | POA: Diagnosis not present

## 2022-05-22 DIAGNOSIS — E785 Hyperlipidemia, unspecified: Secondary | ICD-10-CM | POA: Diagnosis not present

## 2022-05-22 DIAGNOSIS — I10 Essential (primary) hypertension: Secondary | ICD-10-CM | POA: Diagnosis not present

## 2022-05-22 DIAGNOSIS — E890 Postprocedural hypothyroidism: Secondary | ICD-10-CM | POA: Diagnosis not present

## 2022-05-22 DIAGNOSIS — K219 Gastro-esophageal reflux disease without esophagitis: Secondary | ICD-10-CM | POA: Diagnosis not present

## 2022-05-22 DIAGNOSIS — M810 Age-related osteoporosis without current pathological fracture: Secondary | ICD-10-CM | POA: Diagnosis not present

## 2022-05-22 DIAGNOSIS — E039 Hypothyroidism, unspecified: Secondary | ICD-10-CM | POA: Diagnosis not present

## 2022-08-15 DIAGNOSIS — G4733 Obstructive sleep apnea (adult) (pediatric): Secondary | ICD-10-CM | POA: Diagnosis not present

## 2022-08-27 DIAGNOSIS — M810 Age-related osteoporosis without current pathological fracture: Secondary | ICD-10-CM | POA: Diagnosis not present

## 2022-08-29 DIAGNOSIS — H2513 Age-related nuclear cataract, bilateral: Secondary | ICD-10-CM | POA: Diagnosis not present

## 2022-09-03 DIAGNOSIS — M940 Chondrocostal junction syndrome [Tietze]: Secondary | ICD-10-CM | POA: Diagnosis not present

## 2022-09-03 DIAGNOSIS — R079 Chest pain, unspecified: Secondary | ICD-10-CM | POA: Diagnosis not present

## 2022-09-24 DIAGNOSIS — Z6826 Body mass index (BMI) 26.0-26.9, adult: Secondary | ICD-10-CM | POA: Diagnosis not present

## 2022-09-24 DIAGNOSIS — Z124 Encounter for screening for malignant neoplasm of cervix: Secondary | ICD-10-CM | POA: Diagnosis not present

## 2022-09-24 DIAGNOSIS — Z90711 Acquired absence of uterus with remaining cervical stump: Secondary | ICD-10-CM | POA: Diagnosis not present

## 2022-09-24 DIAGNOSIS — M810 Age-related osteoporosis without current pathological fracture: Secondary | ICD-10-CM | POA: Diagnosis not present

## 2022-09-24 DIAGNOSIS — Z01411 Encounter for gynecological examination (general) (routine) with abnormal findings: Secondary | ICD-10-CM | POA: Diagnosis not present

## 2022-09-24 DIAGNOSIS — Z01419 Encounter for gynecological examination (general) (routine) without abnormal findings: Secondary | ICD-10-CM | POA: Diagnosis not present

## 2022-09-30 DIAGNOSIS — E890 Postprocedural hypothyroidism: Secondary | ICD-10-CM | POA: Diagnosis not present

## 2022-10-01 DIAGNOSIS — M67442 Ganglion, left hand: Secondary | ICD-10-CM | POA: Diagnosis not present

## 2022-10-01 DIAGNOSIS — M67441 Ganglion, right hand: Secondary | ICD-10-CM | POA: Diagnosis not present

## 2022-10-07 DIAGNOSIS — R7309 Other abnormal glucose: Secondary | ICD-10-CM | POA: Diagnosis not present

## 2022-10-07 DIAGNOSIS — R2233 Localized swelling, mass and lump, upper limb, bilateral: Secondary | ICD-10-CM | POA: Diagnosis not present

## 2022-10-07 DIAGNOSIS — E890 Postprocedural hypothyroidism: Secondary | ICD-10-CM | POA: Diagnosis not present

## 2022-10-07 DIAGNOSIS — M810 Age-related osteoporosis without current pathological fracture: Secondary | ICD-10-CM | POA: Diagnosis not present

## 2022-11-14 DIAGNOSIS — G4733 Obstructive sleep apnea (adult) (pediatric): Secondary | ICD-10-CM | POA: Diagnosis not present

## 2022-11-22 DIAGNOSIS — N644 Mastodynia: Secondary | ICD-10-CM | POA: Diagnosis not present

## 2022-11-22 DIAGNOSIS — R0781 Pleurodynia: Secondary | ICD-10-CM | POA: Diagnosis not present

## 2022-12-04 DIAGNOSIS — R92323 Mammographic fibroglandular density, bilateral breasts: Secondary | ICD-10-CM | POA: Diagnosis not present

## 2022-12-04 DIAGNOSIS — N644 Mastodynia: Secondary | ICD-10-CM | POA: Diagnosis not present

## 2022-12-04 DIAGNOSIS — R921 Mammographic calcification found on diagnostic imaging of breast: Secondary | ICD-10-CM | POA: Diagnosis not present

## 2022-12-04 DIAGNOSIS — N6310 Unspecified lump in the right breast, unspecified quadrant: Secondary | ICD-10-CM | POA: Diagnosis not present

## 2022-12-28 DIAGNOSIS — G4733 Obstructive sleep apnea (adult) (pediatric): Secondary | ICD-10-CM | POA: Diagnosis not present

## 2023-03-25 DIAGNOSIS — I10 Essential (primary) hypertension: Secondary | ICD-10-CM | POA: Diagnosis not present

## 2023-03-25 DIAGNOSIS — E782 Mixed hyperlipidemia: Secondary | ICD-10-CM | POA: Diagnosis not present

## 2023-03-25 DIAGNOSIS — E559 Vitamin D deficiency, unspecified: Secondary | ICD-10-CM | POA: Diagnosis not present

## 2023-04-09 DIAGNOSIS — E890 Postprocedural hypothyroidism: Secondary | ICD-10-CM | POA: Diagnosis not present

## 2023-04-25 DIAGNOSIS — G4733 Obstructive sleep apnea (adult) (pediatric): Secondary | ICD-10-CM | POA: Diagnosis not present

## 2023-05-15 DIAGNOSIS — F430 Acute stress reaction: Secondary | ICD-10-CM | POA: Diagnosis not present

## 2023-05-15 DIAGNOSIS — I10 Essential (primary) hypertension: Secondary | ICD-10-CM | POA: Diagnosis not present

## 2023-05-15 DIAGNOSIS — M810 Age-related osteoporosis without current pathological fracture: Secondary | ICD-10-CM | POA: Diagnosis not present

## 2023-05-15 DIAGNOSIS — E782 Mixed hyperlipidemia: Secondary | ICD-10-CM | POA: Diagnosis not present

## 2023-05-15 DIAGNOSIS — K219 Gastro-esophageal reflux disease without esophagitis: Secondary | ICD-10-CM | POA: Diagnosis not present

## 2023-05-15 DIAGNOSIS — Z6826 Body mass index (BMI) 26.0-26.9, adult: Secondary | ICD-10-CM | POA: Diagnosis not present

## 2023-05-15 DIAGNOSIS — R931 Abnormal findings on diagnostic imaging of heart and coronary circulation: Secondary | ICD-10-CM | POA: Diagnosis not present

## 2023-05-15 DIAGNOSIS — R7303 Prediabetes: Secondary | ICD-10-CM | POA: Diagnosis not present

## 2023-05-15 DIAGNOSIS — Z0000 Encounter for general adult medical examination without abnormal findings: Secondary | ICD-10-CM | POA: Diagnosis not present

## 2023-05-15 DIAGNOSIS — E559 Vitamin D deficiency, unspecified: Secondary | ICD-10-CM | POA: Diagnosis not present

## 2023-05-16 ENCOUNTER — Other Ambulatory Visit (HOSPITAL_COMMUNITY): Payer: Self-pay | Admitting: Family Medicine

## 2023-05-16 DIAGNOSIS — R931 Abnormal findings on diagnostic imaging of heart and coronary circulation: Secondary | ICD-10-CM

## 2023-05-22 ENCOUNTER — Ambulatory Visit (HOSPITAL_COMMUNITY)
Admission: RE | Admit: 2023-05-22 | Discharge: 2023-05-22 | Disposition: A | Discharge status: Home / Self Care | Payer: PPO | Source: Ambulatory Visit | Attending: Cardiovascular Disease | Admitting: Cardiovascular Disease

## 2023-05-22 DIAGNOSIS — R931 Abnormal findings on diagnostic imaging of heart and coronary circulation: Secondary | ICD-10-CM | POA: Insufficient documentation

## 2023-05-22 NOTE — Progress Notes (Signed)
PATIENT: Brookley Osika DOB: [birth year]  REASON FOR VISIT: follow up HISTORY FROM: patient  Virtual Visit via Telephone Note  I connected with Howell Rucks Gharibian on 05/27/23 at  8:15 AM EDT by telephone and verified that I am speaking with the correct person using two identifiers.   I discussed the limitations, risks, security and privacy concerns of performing an evaluation and management service by telephone and the availability of in person appointments. I also discussed with the patient that there may be a patient responsible charge related to this service. The patient expressed understanding and agreed to proceed.   History of Present Illness:  05/27/23 ALL (mychart): Andrae returns for follow up for OSA on CPAP. She continues to do well. She is using CPAP nightly for about 4 hours. She lost her husband a couple months ago and has been under more stress. She is using a nasal pillow but would like to try a nasal mask. She denies concerns with machine. She does note difficulty using CPAP when she has a cold. No longer using trazodone.     05/21/2022 (Mychart): Toneka returns for follow up for OSA on CPAP. She reports doing well. She is adjusting to therapy. She is using CPAP nightly but does continue to have difficulty meeting 4 our usage each night. She reports waking in the morning and has removed mask unknowingly. She does feel better rested when using therapy.   She continues trazodone 50mg  QHS on occasion. She feels that taking 1/2 of a Benadryl helps more due to congestion. She is able to go to sleep easily.     11/06/2021 ALL (mychart): Timiko Gau is a 72 y.o. female here today for follow up for OSA on CPAP. She has continued working on improving compliance. She is using CPAP most nights. She admits that there are continued concerns of waking up without her mask in place. She feels that she takes it off at some point during the night while asleep. She does  not significant improvement in how she feels when using CPAP. She continues to have difficulty staying asleep. She has been taing 1/2 tablet of benedryl that seems to help some. She has not continued trazodone due to concerns of polypharmacy. She has been taking Wellbutrin and was recently advised to consider adding escitalpram.      07/09/21 ALL (Office):  Haaniya Brewton is a 72 y.o. female here today for follow up for OSA on CPAP. She did not feel original sleep study 11/2019 demonstrated an accurate picture of her sleep disorder. Repeat HST 04/11/2021 confirmed moderate to severe OSA with total AHI 30.4/hr, REM AHI 48.3/hr ans O2 nadir 80%. She was advised to continue CPAP. She reports that she does well with daily compliance. She states that her nasal pillow mask is comfortable but she is concerned that it does not stay on her head night.        Observations/Objective:  Generalized: Well developed, in no acute distress  Mentation: Alert oriented to time, place, history taking. Follows all commands speech and language fluent   Assessment and Plan:  72 y.o. year old female  has a past medical history of Arthritis, Chest pain, Depression, Fibromuscular dysplasia (HCC), GERD (gastroesophageal reflux disease), Hypercholesteremia, Hypertension, Hyperthyroidism, and Osteoporosis. here with    ICD-10-CM   1. OSA on CPAP  G47.33 For home use only DME continuous positive airway pressure (CPAP)      Lexianna is doing fairly well on CPAP therapy. Compliance  report reveals excellent daily and sub optimal four hour usage. She does note improved sleep quality with therapy. She was encouraged to continue using CPAP nightly for at least 4 hours. May discuss mask change with DME. No longer using trazodone. Sleep hygiene reviewed. Healthy lifestyle habits encouraged. She will follow up in 1 year, sooner if needed.   Orders Placed This Encounter  Procedures   For home use only DME continuous  positive airway pressure (CPAP)    Supplies, would like to discuss mask change to nasal mask    Order Specific Question:   Length of Need    Answer:   Lifetime    Order Specific Question:   Patient has OSA or probable OSA    Answer:   Yes    Order Specific Question:   Is the patient currently using CPAP in the home    Answer:   Yes    Order Specific Question:   Settings    Answer:   Other see comments    Order Specific Question:   CPAP supplies needed    Answer:   Mask, headgear, cushions, filters, heated tubing and water chamber    No orders of the defined types were placed in this encounter.    Follow Up Instructions:  I discussed the assessment and treatment plan with the patient. The patient was provided an opportunity to ask questions and all were answered. The patient agreed with the plan and demonstrated an understanding of the instructions.   The patient was advised to call back or seek an in-person evaluation if the symptoms worsen or if the condition fails to improve as anticipated.  I provided 15 minutes of non-face-to-face time during this encounter. Patient located at their place of residence during Mychart visit. Provider is in the office.    Shawnie Dapper, NP

## 2023-05-22 NOTE — Patient Instructions (Addendum)
Please continue using your CPAP regularly. While your insurance requires that you use CPAP at least 4 hours each night on 70% of the nights, I recommend, that you not skip any nights and use it throughout the night if you can. Getting used to CPAP and staying with the treatment long term does take time and patience and discipline. Untreated obstructive sleep apnea when it is moderate to severe can have an adverse impact on cardiovascular health and raise her risk for heart disease, arrhythmias, hypertension, congestive heart failure, stroke and diabetes. Untreated obstructive sleep apnea causes sleep disruption, nonrestorative sleep, and sleep deprivation. This can have an impact on your day to day functioning and cause daytime sleepiness and impairment of cognitive function, memory loss, mood disturbance, and problems focussing. Using CPAP regularly can improve these symptoms.  We will update supply orders, today. Talk to DME about changing your mask if you would like.   Follow up in 1 year

## 2023-05-26 ENCOUNTER — Telehealth: Payer: Self-pay

## 2023-05-26 NOTE — Telephone Encounter (Signed)
Please see MyChart message from today 05/26/2023

## 2023-05-27 ENCOUNTER — Telehealth (INDEPENDENT_AMBULATORY_CARE_PROVIDER_SITE_OTHER): Payer: PPO | Admitting: Family Medicine

## 2023-05-27 ENCOUNTER — Encounter: Payer: Self-pay | Admitting: Family Medicine

## 2023-05-27 DIAGNOSIS — G4733 Obstructive sleep apnea (adult) (pediatric): Secondary | ICD-10-CM

## 2023-05-29 DIAGNOSIS — E890 Postprocedural hypothyroidism: Secondary | ICD-10-CM | POA: Diagnosis not present

## 2023-06-24 DIAGNOSIS — R7303 Prediabetes: Secondary | ICD-10-CM | POA: Diagnosis not present

## 2023-06-24 DIAGNOSIS — E782 Mixed hyperlipidemia: Secondary | ICD-10-CM | POA: Diagnosis not present

## 2023-06-24 DIAGNOSIS — Z713 Dietary counseling and surveillance: Secondary | ICD-10-CM | POA: Diagnosis not present

## 2023-06-24 DIAGNOSIS — I10 Essential (primary) hypertension: Secondary | ICD-10-CM | POA: Diagnosis not present

## 2023-06-30 DIAGNOSIS — M810 Age-related osteoporosis without current pathological fracture: Secondary | ICD-10-CM | POA: Diagnosis not present

## 2023-07-08 ENCOUNTER — Encounter: Payer: Self-pay | Admitting: Cardiology

## 2023-07-08 DIAGNOSIS — E785 Hyperlipidemia, unspecified: Secondary | ICD-10-CM | POA: Insufficient documentation

## 2023-07-08 DIAGNOSIS — R072 Precordial pain: Secondary | ICD-10-CM | POA: Insufficient documentation

## 2023-07-08 NOTE — Progress Notes (Unsigned)
Cardiology Office Note   Date:  07/09/2023   ID:  Alison, [name] 1951/04/20, MRN [id number]  PCP:  Alison Floro, MD  Cardiologist:   Alison Rotunda, MD Referring:  Alison Floro, MD  Chief Complaint  Patient presents with   Abnormal ECG      History of Present Illness: Alison [name] is a 72 y.o. female who presents for evaluation of carotid stenosis.  She had some minimal plaque on recent scanning.  She is also known to have some elevated calcium and was told recently that her LPA was 257.  I saw her in 2017 for chest pain.  She has had a negative treadmill test.  In the distant past she had a cath without coronary disease.    She has had no past cardiac history otherwise.  She feels okay.  She gets about 7000 steps a day.  She is working with a nutritionist because of the plaque that she does have.  Her husband died not long ago from Lewy body dementia.  She been under stress with this.  Her daughter is actually getting married on Friday.  The patient denies any new symptoms such as chest discomfort, neck or arm discomfort. There has been no new shortness of breath, PND or orthopnea. There have been no reported palpitations, presyncope or syncope.    Past Medical History:  Diagnosis Date   Arthritis    Depression    Fibromuscular dysplasia (HCC)    GERD (gastroesophageal reflux disease)    Hypercholesteremia    Hypertension    on meds   Hyperthyroidism    Osteoporosis     Past Surgical History:  Procedure Laterality Date   ABDOMINAL HYSTERECTOMY     APPENDECTOMY     1969   CARDIAC CATHETERIZATION     normal cath , chest pains due to stress   CESAREAN SECTION  1985   colonscopy  2009   LUMBAR LAMINECTOMY/DECOMPRESSION MICRODISCECTOMY  12/23/2011   Procedure: LUMBAR LAMINECTOMY/DECOMPRESSION MICRODISCECTOMY 1 LEVEL;  Surgeon: Hewitt Shorts, MD;  Location: [facility];  Service: Neurosurgery;  Laterality: Right;  RIGHT Lumbar two-  three laminotomy and microdiskectomy   ORIF WRIST FRACTURE  2006   THYROIDECTOMY N/A 01/28/2019   Procedure: TOTAL THYROIDECTOMY;  Surgeon: Darnell Level, MD;  Location: [facility];  Service: General;  Laterality: N/A;   UPPER GI ENDOSCOPY       Current Outpatient Medications  Medication Sig Dispense Refill   amLODipine-benazepril (LOTREL) 5-10 MG per capsule Take 1 capsule by mouth every other day.      atorvastatin (LIPITOR) 20 MG tablet Take 20 mg by mouth daily.     Biotin 1000 MCG tablet Take 1,000 mcg by mouth every other day.      buPROPion (WELLBUTRIN XL) 300 MG 24 hr tablet Take 300 mg by mouth daily.     Cholecalciferol (VITAMIN D-3) 25 MCG (1000 UT) CAPS Take 1,000 Units by mouth daily.      Coenzyme Q10 (CO Q-10) 200 MG CAPS Take 200 mg by mouth daily.      levothyroxine (SYNTHROID) 112 MCG tablet Take 112 mcg by mouth daily before breakfast.     Multiple Vitamins-Minerals (ONE-A-DAY WOMENS 50 PLUS PO) Take 1 tablet by mouth daily.     omeprazole (PRILOSEC) 20 MG capsule Take 20 mg by mouth every other day.     PROLIA 60 MG/ML SOSY injection Inject 60 mg into the muscle every 6 (six) months.  1   No current facility-administered medications for this visit.    Allergies:   Shellfish allergy, Betadine [povidone iodine], Penicillins, Tetracycline hcl, and Tramadol    Social History:  The patient  reports that she has never smoked. She has never used smokeless tobacco. She reports current alcohol use. She reports that she does not use drugs.   Family History:  The patient's family history includes Atrial fibrillation in her father; Brain cancer in her mother; Diabetes in her father; Heart disease in her father; Hyperlipidemia in her father and mother; Hypothyroidism in her sister; Lupus in her paternal uncle; Osteoarthritis in her paternal aunt; Peripheral vascular disease in her father.    ROS:  Please see the history of present illness.   Otherwise, review of systems are positive  for none.   All other systems are reviewed and negative.    PHYSICAL EXAM: VS:  BP 118/78 (BP Location: Left Arm, Patient Position: Sitting, Cuff Size: Normal)   Pulse 68   Ht 5\' 1"  (1.549 m)   Wt 135 lb 6.4 oz (61.4 kg)   SpO2 99%   BMI 25.58 kg/m  , BMI Body mass index is 25.58 kg/m. GENERAL:  Well appearing HEENT:  Pupils equal round and reactive, fundi not visualized, oral mucosa unremarkable NECK:  No jugular venous distention, waveform within normal limits, carotid upstroke brisk and symmetric, no bruits, no thyromegaly LYMPHATICS:  No cervical, inguinal adenopathy LUNGS:  Clear to auscultation bilaterally BACK:  No CVA tenderness CHEST:  Unremarkable HEART:  PMI not displaced or sustained,S1 and S2 within normal limits, no S3, no S4, no clicks, no rubs, no murmurs ABD:  Flat, positive bowel sounds normal in frequency in pitch, no bruits, no rebound, no guarding, no midline pulsatile mass, no hepatomegaly, no splenomegaly EXT:  2 plus pulses throughout, no edema, no cyanosis no clubbing SKIN:  No rashes no nodules NEURO:  Cranial nerves II through XII grossly intact, motor grossly intact throughout [facility]:  Cognitively intact, oriented to person place and time    EKG:  EKG Interpretation Date/Time:  Wednesday July 09 2023 08:01:21 EST Ventricular Rate:  68 PR Interval:  134 QRS Duration:  122 QT Interval:  422 QTC Calculation: 448 R Axis:   29  Text Interpretation: Normal sinus rhythm Right bundle branch block When compared with ECG of 25-Jan-2019 15:30, Right bundle branch block is now Present Confirmed by Alison [name] ([id number]) on 07/09/2023 8:05:38 AM     Recent Labs: No results found for requested labs within last 365 days.    Lipid Panel No results found for: "CHOL", "TRIG", "HDL", "CHOLHDL", "VLDL", "LDLCALC", "LDLDIRECT"    Wt Readings from Last 3 Encounters:  07/09/23 135 lb 6.4 oz (61.4 kg)  07/09/21 145 lb (65.8 kg)  02/13/21 141 lb (64 kg)       Other studies Reviewed: Additional studies/ records that were reviewed today include:   Labs, CT. Review of the above records demonstrates:  Please see elsewhere in the note.     ASSESSMENT AND PLAN:  Elevated LPa: We discussed this.  We discussed the best management to be treatment of her lipids and she recently was switched from Zocor to Lipitor.  I agree with this.  I would suggest a goal LDL of 70.  I have a low threshold to increase her Lipitor to 40 mg if she is not at target but follow-up with her primary provider.  HTN: Her blood pressure is controlled on every other day therapy.  No change in therapy.  Elevated coronary calcium this was very minimal.  She does have some calcium around her aortic valve but no murmur and I would not suggest this to be clinically significant.  No further imaging.  Carotid stenosis: This was very mild and I will follow this up in about 5 years.  Right bundle branch block: This is new since previous EKG but she has not had any symptoms consistent with conduction disturbance.  No specific imaging or change in therapy is indicated.  Current medicines are reviewed at length with the patient today.  The patient does not have concerns regarding medicines.  The following changes have been made:  no change  Labs/ tests ordered today include:   Orders Placed This Encounter  Procedures   EKG 12-Lead     Disposition:   FU with with me in two years.     Signed, Alison Rotunda, MD  07/09/2023 8:38 AM    [facility]

## 2023-07-09 ENCOUNTER — Encounter: Payer: Self-pay | Admitting: Cardiology

## 2023-07-09 ENCOUNTER — Ambulatory Visit: Payer: PPO | Attending: Cardiology | Admitting: Cardiology

## 2023-07-09 VITALS — BP 118/78 | HR 68 | Ht 61.0 in | Wt 135.4 lb

## 2023-07-09 DIAGNOSIS — E785 Hyperlipidemia, unspecified: Secondary | ICD-10-CM

## 2023-07-09 DIAGNOSIS — I10 Essential (primary) hypertension: Secondary | ICD-10-CM

## 2023-07-09 DIAGNOSIS — R072 Precordial pain: Secondary | ICD-10-CM | POA: Diagnosis not present

## 2023-07-09 NOTE — Patient Instructions (Signed)
Medication Instructions:  No changes.  *If you need a refill on your cardiac medications before your next appointment, please call your pharmacy*   Follow-Up: At [facility], you and your health needs are our priority.  As part of our continuing mission to provide you with exceptional heart care, we have created designated Provider Care Teams.  These Care Teams include your primary Cardiologist (physician) and Advanced Practice Providers (APPs -  Physician Assistants and Nurse Practitioners) who all work together to provide you with the care you need, when you need it.  Your next appointment:   2 year(s)  Provider:   Rollene Rotunda, MD

## 2023-07-28 DIAGNOSIS — G4733 Obstructive sleep apnea (adult) (pediatric): Secondary | ICD-10-CM | POA: Diagnosis not present

## 2023-08-01 DIAGNOSIS — N644 Mastodynia: Secondary | ICD-10-CM | POA: Diagnosis not present

## 2023-09-17 DIAGNOSIS — E782 Mixed hyperlipidemia: Secondary | ICD-10-CM | POA: Diagnosis not present

## 2023-10-01 DIAGNOSIS — E890 Postprocedural hypothyroidism: Secondary | ICD-10-CM | POA: Diagnosis not present

## 2023-11-05 DIAGNOSIS — K31A0 Gastric intestinal metaplasia, unspecified: Secondary | ICD-10-CM | POA: Diagnosis not present

## 2023-11-05 DIAGNOSIS — K219 Gastro-esophageal reflux disease without esophagitis: Secondary | ICD-10-CM | POA: Diagnosis not present

## 2023-11-26 DIAGNOSIS — K222 Esophageal obstruction: Secondary | ICD-10-CM | POA: Diagnosis not present

## 2023-11-26 DIAGNOSIS — K2930 Chronic superficial gastritis without bleeding: Secondary | ICD-10-CM | POA: Diagnosis not present

## 2023-11-26 DIAGNOSIS — K31A0 Gastric intestinal metaplasia, unspecified: Secondary | ICD-10-CM | POA: Diagnosis not present

## 2023-11-26 DIAGNOSIS — Q399 Congenital malformation of esophagus, unspecified: Secondary | ICD-10-CM | POA: Diagnosis not present

## 2023-12-01 DIAGNOSIS — K2930 Chronic superficial gastritis without bleeding: Secondary | ICD-10-CM | POA: Diagnosis not present

## 2023-12-31 DIAGNOSIS — G4733 Obstructive sleep apnea (adult) (pediatric): Secondary | ICD-10-CM | POA: Diagnosis not present

## 2024-02-03 DIAGNOSIS — S92525A Nondisplaced fracture of medial phalanx of left lesser toe(s), initial encounter for closed fracture: Secondary | ICD-10-CM | POA: Diagnosis not present

## 2024-02-18 DIAGNOSIS — M810 Age-related osteoporosis without current pathological fracture: Secondary | ICD-10-CM | POA: Diagnosis not present

## 2024-02-18 DIAGNOSIS — Z1231 Encounter for screening mammogram for malignant neoplasm of breast: Secondary | ICD-10-CM | POA: Diagnosis not present

## 2024-03-02 ENCOUNTER — Telehealth: Payer: Self-pay | Admitting: Cardiology

## 2024-03-02 DIAGNOSIS — E785 Hyperlipidemia, unspecified: Secondary | ICD-10-CM

## 2024-03-02 DIAGNOSIS — Z79899 Other long term (current) drug therapy: Secondary | ICD-10-CM

## 2024-03-02 DIAGNOSIS — E7800 Pure hypercholesterolemia, unspecified: Secondary | ICD-10-CM

## 2024-03-02 DIAGNOSIS — E782 Mixed hyperlipidemia: Secondary | ICD-10-CM

## 2024-03-02 NOTE — Telephone Encounter (Signed)
 Called and spoke with patient. Patient states that she would like a referral to see Dr. Mona to manage Lpa. Patient reports strong family history of elevated Lpa. Labs from 09/18/2023 show Lpa of 280.9 and labs from 03/28/2023 show Lpa of 231.7 (Care Everywhere). Patient states she is currently taking Zetia 10 mg daily and Lipitor 20 mg daily.

## 2024-03-02 NOTE — Telephone Encounter (Signed)
 New Message:     Patient says her Cholesterol is high. She would like for Dr Lavona to please refer her to Dr Mona, so he can evaluate her for her Cholesterol.

## 2024-03-04 NOTE — Telephone Encounter (Signed)
 Called and spoke with patient. Advised patient that referral was placed to see Dr. Mona per Dr. Lavona and she will be contacted to schedule appointment.

## 2024-03-08 DIAGNOSIS — Z1331 Encounter for screening for depression: Secondary | ICD-10-CM | POA: Diagnosis not present

## 2024-03-08 DIAGNOSIS — Z01419 Encounter for gynecological examination (general) (routine) without abnormal findings: Secondary | ICD-10-CM | POA: Diagnosis not present

## 2024-03-23 DIAGNOSIS — G4733 Obstructive sleep apnea (adult) (pediatric): Secondary | ICD-10-CM | POA: Diagnosis not present

## 2024-03-24 LAB — LAB REPORT - SCANNED
A1c: 5.7
Creatinine, POC: 102 mg/dL
EGFR: 63.4

## 2024-03-29 ENCOUNTER — Encounter (HOSPITAL_BASED_OUTPATIENT_CLINIC_OR_DEPARTMENT_OTHER): Payer: Self-pay | Admitting: Internal Medicine

## 2024-03-29 ENCOUNTER — Ambulatory Visit (HOSPITAL_BASED_OUTPATIENT_CLINIC_OR_DEPARTMENT_OTHER): Admitting: Internal Medicine

## 2024-03-29 VITALS — BP 104/76 | HR 66 | Ht 61.5 in | Wt 132.8 lb

## 2024-03-29 DIAGNOSIS — E7841 Elevated Lipoprotein(a): Secondary | ICD-10-CM | POA: Diagnosis not present

## 2024-03-29 DIAGNOSIS — E785 Hyperlipidemia, unspecified: Secondary | ICD-10-CM

## 2024-03-29 DIAGNOSIS — I2510 Atherosclerotic heart disease of native coronary artery without angina pectoris: Secondary | ICD-10-CM

## 2024-03-29 NOTE — Patient Instructions (Signed)
 Medication Instructions:   Your physician recommends that you continue on your current medications as directed. Please refer to the Current Medication list given to you today.  *If you need a refill on your cardiac medications before your next appointment, please call your pharmacy*    Follow-Up:  AS NEEDED WITH DR. HILTY

## 2024-03-29 NOTE — Progress Notes (Unsigned)
 LIPID CLINIC CONSULT NOTE  Chief Complaint:  Manage dyslipidemia  Primary Care Physician: Okey Carlin Redbird, MD  Primary Cardiologist:  Lynwood Schilling, MD  HPI:  Alison [name] is a 73 y.o. female who is being seen today for the evaluation of dyslipidemia at the request of Schilling Lynwood, MD. This is a pleasant 73 year old female kindly referred for evaluation management of dyslipidemia.  She has been followed by Dr. Schilling and had a remote cardiac catheterization which was normal.  She had a coronary calcium  score in August 2023 which showed a low calcium  score of 4, 46 percentile for age and sex matched controls.  Aortic atherosclerosis was noted.  Her target LDL therefore is less than 70.  She did have recent lipid testing in February 2025 showing total cholesterol 158, HDL 37, triglycerides 121 and LDL 99.  She also had outside testing for LP(a) which was significantly elevated 257 nmol/L.  Based on this her primary cardiologist switched her from simvastatin to atorvastatin  to try to target LDL less than 70.  She did have recent lipid testing again for an insurance physical earlier this month which showed total cholesterol 126, HDL 41, triglycerides 96 and LDL 65.  PMHx:  Past Medical History:  Diagnosis Date   Arthritis    Depression    Fibromuscular dysplasia (HCC)    GERD (gastroesophageal reflux disease)    Hypercholesteremia    Hypertension    on meds   Hyperthyroidism    Osteoporosis     Past Surgical History:  Procedure Laterality Date   ABDOMINAL HYSTERECTOMY     APPENDECTOMY     1969   CARDIAC CATHETERIZATION     normal cath , chest pains due to stress   CESAREAN SECTION  1985   colonscopy  2009   LUMBAR LAMINECTOMY/DECOMPRESSION MICRODISCECTOMY  12/23/2011   Procedure: LUMBAR LAMINECTOMY/DECOMPRESSION MICRODISCECTOMY 1 LEVEL;  Surgeon: Lamar LELON Peaches, MD;  Location: [facility];  Service: Neurosurgery;  Laterality: Right;  RIGHT Lumbar two-  three laminotomy and microdiskectomy   ORIF WRIST FRACTURE  2006   THYROIDECTOMY N/A 01/28/2019   Procedure: TOTAL THYROIDECTOMY;  Surgeon: Eletha Boas, MD;  Location: [facility];  Service: General;  Laterality: N/A;   UPPER GI ENDOSCOPY      FAMHx:  Family History  Problem Relation Age of Onset   Hyperlipidemia Mother    Brain cancer Mother    Heart disease Father        stent , heart valve   Hyperlipidemia Father    Diabetes Father    Peripheral vascular disease Father    Atrial fibrillation Father    Hypothyroidism Sister    Osteoarthritis Paternal Aunt    Lupus Paternal Uncle    Anesthesia problems Neg Hx    Hypotension Neg Hx    Malignant hyperthermia Neg Hx    Pseudochol deficiency Neg Hx     SOCHx:   reports that she has never smoked. She has never used smokeless tobacco. She reports current alcohol use. She reports that she does not use drugs.  ALLERGIES:  Allergies  Allergen Reactions   Shellfish Allergy Anaphylaxis and Hives   Betadine [Povidone Iodine] Other (See Comments)    Unspecified reaction to topical betadine used for skin prep.    Penicillins Hives and Swelling    Did it involve swelling of the face/tongue/throat, SOB, or low BP? Unknown Did it involve sudden or severe rash/hives, skin peeling, or any reaction on the inside of your  mouth or nose? Unknown Did you need to seek medical attention at a hospital or doctor's office? Unknown When did it last happen?      childhood allergy If all above answers are "NO", may proceed with cephalosporin use.    Tetracycline Hcl Hives   Tramadol  Nausea And Vomiting    ROS: Pertinent items noted in HPI and remainder of comprehensive ROS otherwise negative.  HOME MEDS: Current Outpatient Medications on File Prior to Visit  Medication Sig Dispense Refill   amLODipine -benazepril  (LOTREL) 5-10 MG per capsule Take 1 capsule by mouth every other day.      atorvastatin  (LIPITOR) 20 MG tablet Take 20 mg by mouth daily.      Biotin 1000 MCG tablet Take 1,000 mcg by mouth every other day.      buPROPion  (WELLBUTRIN  XL) 300 MG 24 hr tablet Take 300 mg by mouth daily.     Cholecalciferol (VITAMIN D-3) 25 MCG (1000 UT) CAPS Take 1,000 Units by mouth daily.      Coenzyme Q10 (CO Q-10) 200 MG CAPS Take 200 mg by mouth daily.      ezetimibe (ZETIA) 10 MG tablet Take 10 mg by mouth daily.     levothyroxine (SYNTHROID) 112 MCG tablet Take 112 mcg by mouth daily before breakfast.     metFORMIN (GLUCOPHAGE) 500 MG tablet Take 1,000 mg by mouth daily.     Multiple Vitamins-Minerals (ONE-A-DAY WOMENS 50 PLUS PO) Take 1 tablet by mouth daily.     omeprazole (PRILOSEC) 20 MG capsule Take 20 mg by mouth every other day.     PROLIA 60 MG/ML SOSY injection Inject 60 mg into the muscle every 6 (six) months.  1   No current facility-administered medications on file prior to visit.    LABS/IMAGING: No results found for this or any previous visit (from the past 48 hours). No results found.  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   Alison [name] is on guideline directed statin therapy with atorvastatin  20 mg daily and ezetimibe 10 mg daily and is to achieve an LDL of 65.  She had an incidental finding of an elevated LP(a) of 257 nmol/L.  Despite this she had very little coronary calcium  2 years ago.  At this point since her cholesterol is very well treated on combination therapy I think there is little room for  benefit of a PCSK9 inhibitor although that is the only medicine on the market right now that could provide some improvement in her LP(a).  She might be a candidate for upcoming clinical trial although she had very little coronary calcium  but does have some other risk factors including hypertension, minimal carotid stenosis and a right bundle branch block and EKG.  She seemed interested in a clinical trial if she were a candidate.  We will reach out to her if she might qualify.  Otherwise  I would recommend continuing her current therapy.  She is not currently on aspirin therapy, however there is a suggestion this may be beneficial to mitigate the thrombotic risk of LP(a).  Most of the data is for very high LP(a) over 400.  Follow-up with me as needed.  [name], MD, [name], FNLA, FACP  [facility]  Medical Director of the Advanced Lipid Disorders &  Cardiovascular Risk Reduction Clinic Diplomate of the American Board of Clinical Lipidology Attending Cardiologist  Direct Dial: [phone]  Fax: [phone]  Website:  [email or web address] Vinie JAYSON Maxcy 03/29/2024, 12:23 PM

## 2024-04-06 DIAGNOSIS — R7309 Other abnormal glucose: Secondary | ICD-10-CM | POA: Diagnosis not present

## 2024-04-06 DIAGNOSIS — E890 Postprocedural hypothyroidism: Secondary | ICD-10-CM | POA: Diagnosis not present

## 2024-04-22 DIAGNOSIS — I10 Essential (primary) hypertension: Secondary | ICD-10-CM | POA: Diagnosis not present

## 2024-04-22 DIAGNOSIS — E785 Hyperlipidemia, unspecified: Secondary | ICD-10-CM | POA: Diagnosis not present

## 2024-04-22 DIAGNOSIS — M810 Age-related osteoporosis without current pathological fracture: Secondary | ICD-10-CM | POA: Diagnosis not present

## 2024-04-22 DIAGNOSIS — E890 Postprocedural hypothyroidism: Secondary | ICD-10-CM | POA: Diagnosis not present

## 2024-04-22 DIAGNOSIS — R7309 Other abnormal glucose: Secondary | ICD-10-CM | POA: Diagnosis not present

## 2024-05-10 DIAGNOSIS — H2513 Age-related nuclear cataract, bilateral: Secondary | ICD-10-CM | POA: Diagnosis not present

## 2024-05-10 DIAGNOSIS — E119 Type 2 diabetes mellitus without complications: Secondary | ICD-10-CM | POA: Diagnosis not present

## 2024-05-12 ENCOUNTER — Other Ambulatory Visit (HOSPITAL_BASED_OUTPATIENT_CLINIC_OR_DEPARTMENT_OTHER): Payer: Self-pay | Admitting: Obstetrics & Gynecology

## 2024-05-12 DIAGNOSIS — M810 Age-related osteoporosis without current pathological fracture: Secondary | ICD-10-CM

## 2024-05-20 DIAGNOSIS — E559 Vitamin D deficiency, unspecified: Secondary | ICD-10-CM | POA: Diagnosis not present

## 2024-05-20 DIAGNOSIS — I10 Essential (primary) hypertension: Secondary | ICD-10-CM | POA: Diagnosis not present

## 2024-05-20 DIAGNOSIS — E782 Mixed hyperlipidemia: Secondary | ICD-10-CM | POA: Diagnosis not present

## 2024-05-20 DIAGNOSIS — R7303 Prediabetes: Secondary | ICD-10-CM | POA: Diagnosis not present

## 2024-05-24 DIAGNOSIS — E782 Mixed hyperlipidemia: Secondary | ICD-10-CM | POA: Diagnosis not present

## 2024-05-24 DIAGNOSIS — K219 Gastro-esophageal reflux disease without esophagitis: Secondary | ICD-10-CM | POA: Diagnosis not present

## 2024-05-24 DIAGNOSIS — Z0000 Encounter for general adult medical examination without abnormal findings: Secondary | ICD-10-CM | POA: Diagnosis not present

## 2024-05-24 DIAGNOSIS — F430 Acute stress reaction: Secondary | ICD-10-CM | POA: Diagnosis not present

## 2024-05-24 DIAGNOSIS — Z6825 Body mass index (BMI) 25.0-25.9, adult: Secondary | ICD-10-CM | POA: Diagnosis not present

## 2024-05-24 DIAGNOSIS — K222 Esophageal obstruction: Secondary | ICD-10-CM | POA: Diagnosis not present

## 2024-05-24 DIAGNOSIS — I10 Essential (primary) hypertension: Secondary | ICD-10-CM | POA: Diagnosis not present

## 2024-05-25 NOTE — Patient Instructions (Signed)

## 2024-05-25 NOTE — Progress Notes (Unsigned)
 PATIENT: Alison [name] DOB: 01/23/1951  REASON FOR VISIT: follow up HISTORY FROM: patient  Virtual Visit via Telephone Note  I connected with Alison [name] on 05/26/24 at  9:30 AM EDT by telephone and verified that I am speaking with the correct person using two identifiers.   I discussed the limitations, risks, security and privacy concerns of performing an evaluation and management service by telephone and the availability of in person appointments. I also discussed with the patient that there may be a patient responsible charge related to this service. The patient expressed understanding and agreed to proceed.   History of Present Illness:  05/26/24 ALL (Mychart): Alison [name] returns for follow up for OSA on CPAP. She is doing well on therapy. She is using CPAP nightly for about . She denies concerns with machine or supplies. She admits that she does not always use CPAP for the entire night. She feels she is resting well. She is followed by Dr Lavona and [facility], cardiology. RBBB noted on EKG. She continues atorvastatin  and Zetia.     05/27/2023 ALL (Mychart):  Alison [name] returns for follow up for OSA on CPAP. She continues to do well. She is using CPAP nightly for about 4 hours. She lost her husband a couple months ago and has been under more stress. She is using a nasal pillow but would like to try a nasal mask. She denies concerns with machine. She does note difficulty using CPAP when she has a cold. No longer using trazodone .     05/21/2022 (Mychart): Alison [name] returns for follow up for OSA on CPAP. She reports doing well. She is adjusting to therapy. She is using CPAP nightly but does continue to have difficulty meeting 4 our usage each night. She reports waking in the morning and has removed mask unknowingly. She does feel better rested when using therapy.   She continues trazodone  50mg  QHS on occasion. She feels that taking 1/2 of a Benadryl helps more due to congestion.  She is able to go to sleep easily.     11/06/2021 ALL (mychart): Alison [name] is a 73 y.o. female here today for follow up for OSA on CPAP. She has continued working on improving compliance. She is using CPAP most nights. She admits that there are continued concerns of waking up without her mask in place. She feels that she takes it off at some point during the night while asleep. She does not significant improvement in how she feels when using CPAP. She continues to have difficulty staying asleep. She has been taing 1/2 tablet of benedryl that seems to help some. She has not continued trazodone  due to concerns of polypharmacy. She has been taking Wellbutrin  and was recently advised to consider adding escitalpram.      07/09/21 ALL (Office):  Alison [name] is a 73 y.o. female here today for follow up for OSA on CPAP. She did not feel original sleep study 11/2019 demonstrated an accurate picture of her sleep disorder. Repeat HST 04/11/2021 confirmed moderate to severe OSA with total AHI 30.4/hr, REM AHI 48.3/hr ans O2 nadir 80%. She was advised to continue CPAP. She reports that she does well with daily compliance. She states that her nasal pillow mask is comfortable but she is concerned that it does not stay on her head night.        Observations/Objective:  Generalized: Well developed, in no acute distress  Mentation: Alert oriented to time, place, history taking. Follows all commands speech  and language fluent   Assessment and Plan:  73 y.o. year old female  has a past medical history of Arthritis, Depression, Fibromuscular dysplasia, GERD (gastroesophageal reflux disease), Hypercholesteremia, Hypertension, Hyperthyroidism, and Osteoporosis. here with    ICD-10-CM   1. OSA on CPAP  G47.33 For home use only DME continuous positive airway pressure (CPAP)     Alison [name] is doing fairly well on CPAP therapy. Compliance report reveals excellent daily and sub optimal four hour  usage. She does note improved sleep quality with therapy. She was encouraged to continue using CPAP nightly for at least 4 hours. May discuss mask change with DME. Healthy lifestyle habits encouraged. She will follow up in 1 year, sooner if needed.   Orders Placed This Encounter  Procedures   For home use only DME continuous positive airway pressure (CPAP)    Heated Humidity with all supplies as needed    Length of Need:   Lifetime    Patient has OSA or probable OSA:   Yes    Is the patient currently using CPAP in the home:   Yes    Settings:   Other see comments    CPAP supplies needed:   Mask, headgear, cushions, filters, heated tubing and water chamber    No orders of the defined types were placed in this encounter.    Follow Up Instructions:  I discussed the assessment and treatment plan with the patient. The patient was provided an opportunity to ask questions and all were answered. The patient agreed with the plan and demonstrated an understanding of the instructions.   The patient was advised to call back or seek an in-person evaluation if the symptoms worsen or if the condition fails to improve as anticipated.  I provided 15 minutes of non-face-to-face time during this encounter. Patient located at their place of residence during Mychart visit. Provider is in the office.    Absalom Aro, NP

## 2024-05-26 ENCOUNTER — Encounter: Payer: Self-pay | Admitting: Family Medicine

## 2024-05-26 ENCOUNTER — Telehealth: Payer: PPO | Admitting: Family Medicine

## 2024-05-26 DIAGNOSIS — G4733 Obstructive sleep apnea (adult) (pediatric): Secondary | ICD-10-CM

## 2024-06-01 ENCOUNTER — Ambulatory Visit (HOSPITAL_BASED_OUTPATIENT_CLINIC_OR_DEPARTMENT_OTHER): Admitting: Cardiovascular Disease

## 2024-06-06 DIAGNOSIS — M940 Chondrocostal junction syndrome [Tietze]: Secondary | ICD-10-CM | POA: Diagnosis not present

## 2024-06-06 DIAGNOSIS — R0789 Other chest pain: Secondary | ICD-10-CM | POA: Diagnosis not present

## 2024-06-28 ENCOUNTER — Encounter: Payer: Self-pay | Admitting: *Deleted

## 2024-06-28 DIAGNOSIS — Z006 Encounter for examination for normal comparison and control in clinical research program: Secondary | ICD-10-CM

## 2024-06-28 NOTE — Research (Signed)
 Message left for Alison [name] about pre-event research study. Encouraged to call with any questions.

## 2024-06-28 NOTE — Research (Signed)
 Ms Wiginton called back. She states she is interested in coming in for pre-event. Scheduled her for Nov 25 at 1300.

## 2024-07-06 ENCOUNTER — Encounter: Admitting: *Deleted

## 2024-07-06 DIAGNOSIS — Z006 Encounter for examination for normal comparison and control in clinical research program: Secondary | ICD-10-CM

## 2024-07-06 NOTE — Research (Addendum)
 "    LPa 271 nmol/L   I will call and schedule main screening.   Any further action needed to be taken per the PI?  No, thanks.  Alison KYM Maxcy, MD, [facility], FNLA, FACP  [place]  [facility]  Medical Director of the Advanced Lipid Disorders &  Cardiovascular Risk Reduction Clinic Diplomate of the American Board of Clinical Lipidology Attending Cardiologist  Direct Dial: [phone]  Fax: [phone]  Website:  [email or web address]   LPa Screening consent     Subject Name: Alison [name] [facility]  Subject met inclusion and exclusion criteria.  The informed consent form, study requirements and expectations were reviewed with the subject and questions and concerns were addressed prior to the signing of the consent form.  The subject verbalized understanding of the trial requirements.  The subject agreed to participate in the pre-event LPa screening  trial and signed the informed consent at 1320 on 06-Jul-2024.  The informed consent was obtained prior to performance of any protocol-specific procedures for the subject.  A copy of the signed informed consent was given to the subject and a copy was placed in the subject's medical record.   Alison [name] Ward   Alison [name] was placed in a quit room and given the chance to ask questions. Blood drawn at 1335. Meds and medical history reviewed.   Pre-event  INCLUSION  Yes No  Participant has provided written informed consent before initiation of any study-specific activities/procedures. [x]  []   Age >= 50 years at the time of signing of the Lp(a) screening ICF. []  []   Lp(a) >= 200 nmol/L during Lp(a) screening by a central laboratory using an investigational IVD test. At least 4 weeks of stable and optimized lipid-lowering therapy consistent with regional/local clinical practice guidelines or according to investigators judgment before Lp(a) screening.                                                                                                          _______________ Waiting on results    Participants meeting at least one of the following categories (A or B):  A. Multiple risk factors for atherosclerotic disease  1. Presence of >= 4 of any of the following ASCVD risk factors: a. Age >= 65 years men or women b. History of hypertension requiring pharmacotherapy c. Current cigarette tobacco smoking d. Diabetes mellitus (Type 1 or Type 2) requiring pharmacotherapy e. Screening high-sensitivity C-reactive protein (hs-CRP) >= 2.0 mg/L by central laboratory f. Screening estimated glomerular filtration rate (eGFR) 30 to < 60 mL/min/1.73 m2 by central laboratory g. Familial hypercholesterolemia  or family history of premature ASCVD (1st degree relative before age 23 years for males, and/or 1st degree relative before age 1 years for females)  AND/OR  B. History of atherosclerosis evidenced by:  1. Coronary Artery Disease-Reporting and Data System (CAD-RADS) P3, and/or 2. Coronary artery calcium  (CAC) > 300, and/or                          CAC 4 3. Atherosclerosis  defined by at least one of the following AND >= 2 additional risk factors listed in 104 A: a. Coronary atherosclerosis as evidenced by a stenosis >= 50% in at least one artery b. Coronary atherosclerosis as evidenced by a stenosis >= 25% (or reported as at least mild) in at least 2 arteries detected on invasive or noninvasive imaging c. Coronary atherosclerosis as evidenced by a CAC score > 100 and/or CAD-RADS >= P2 d. Carotid atherosclerosis as evidenced by an internal carotid stenosis >= 50% e. Peripheral atherosclerosis as evidenced by >= 50% stenosis of limb artery and/or ankle brachial index < 0.9                                                                     Bold the above patient meets.  []   Waiting on lab results  []   EXCLUSION Yes No  Prior acute atherothrombotic qualifying event at any time, defined as prior myocardial infarction, prior  stroke, prior transient ischemic attack, or prior acute limb ischemia. []  [x]   Prior arterial revascularization at any time suspected to be associated with atherosclerosis. []  [x]   If available, participants without known atherosclerosis, CAC score = 0, CAD-RADS = 0, in the last 10 years prior to enrollment. []  [x]   Severe renal dysfunction, defined as an eGFR < 30 mL/min/1.73 m2 by central laboratory during screening []  [x]   History of decompensated liver cirrhosis and/or screening aspartate aminotransferase (AST) or alanine aminotransferase (ALT) > 3 x upper limit of normal (ULN), or total bilirubin (TBL) > 2 x ULN (except in stable Gilberts syndrome). []  [x]   History of major bleeding disorder (eg, hemophilia, von Willebrand disease, clotting factor deficiencies, etc). []  [x]   Planned arterial revascularization (percutaneous or surgical). []  [x]   Fasting triglycerides > 400 mg/dL (5.47 mmol/L) during screening. []  [x]   Participant has known sensitivity to any of the products or components to be administered during dosing or the study procedures. []  [x]   Malignancy not in remission for at least 5 years prior to enrollment, exceptions for less than 5 years since remission include non-melanoma skin cancers, localized thyroid  cancer (papillary, follicular, and medullary), cervical in situ carcinoma, breast ductal carcinoma in situ, or stage 1 prostate carcinoma. []  [x]   Diagnosis of severe heart failure ([place]  Heart Association Functional Classification IV), and/or if available, most recent left ventricular ejection fraction < 30%. []  [x]   Uncontrolled or recurrent ventricular tachycardia in the past 3 months before enrollment. []  [x]   Atrial fibrillation or flutter and not on an anticoagulant despite an indication to be anticoagulated.    Uncontrolled hypertension at screening, defined as systolic blood pressure > 180 mmHg or diastolic blood pressure > 110 mmHg at rest  despite antihypertensive therapy. []  [x]   Diabetes mellitus (Type 1 or Type 2) with a hemoglobin A1C (HbA1c) >= 10% by central laboratory at screening. []  [x]   History or evidence of any other clinically significant disorder, condition, or disease (such as active infection) that, in the opinion of the investigator or [employer] physician, if consulted, would pose a risk to participant safety, or interfere with the study evaluation, procedures or completion. []  [x]   Current or planned lipoprotein apheresis or < 3 months since last apheresis treatment before enrollment. []  [x]   Participant  has taken a cholesteryl ester transfer protein inhibitor or lomitapide in the last 12 months before enrollment. []  [x]   Previously received any therapy specifically targeting Lp(a) including but not limited to, olpasiran, pelacarsen, lepodisiran, zerlasiran, or muvalaplin []  [x]    Currently receiving treatment in another investigational device or drug study, or less than 30 days since ending treatment on another investigational device or drug study(ies). Other investigational procedures while participating in this study are excluded. []  [x]   Participants of childbearing potential unwilling to use protocol-specified method of contraception during treatment and for an additional 30 days after the last dose of investigational product.  []  [x]   Participants who are breastfeeding or who plan to breastfeed while on study through 30 days after the last dose of investigational product []  [x]   Participants planning to become pregnant while on study through 30 days after the last dose of investigational product.  []  [x]   Participants of childbearing potential with a positive pregnancy test assessed at screening and/or day 1 by a highly sensitive urine or serum pregnancy test. []  [x]   Participant likely to not be available to complete all protocol-required study visits or procedures, and/or to comply with all required study  procedures (eg, Clinical Outcome Assessments) to the best of the Participant and investigators knowledge.                         []  [x]      Pre-Event Lp(a) Screening Visit  Date of Visit: 06-Jul-2024            Subject Number:[id number]  During this visit the following activities were completed:  [x] Reading, Signing and Understanding the informed Consent for Lp(a) screening  [x] Demographics  [x] Medical History  [x] Adverse events  [x] Serious adverse device effects  [x] Concomitant therapies reviewed  [x] Central labs      [x] Lp(a)    Current Outpatient Medications:    amLODipine -benazepril  (LOTREL) 5-10 MG per capsule, Take 1 capsule by mouth every other day. , Disp: , Rfl:    atorvastatin  (LIPITOR) 20 MG tablet, Take 20 mg by mouth daily., Disp: , Rfl:    Biotin 1000 MCG tablet, Take 1,000 mcg by mouth every other day. , Disp: , Rfl:    buPROPion  (WELLBUTRIN  XL) 300 MG 24 hr tablet, Take 300 mg by mouth daily., Disp: , Rfl:    Cholecalciferol (VITAMIN D-3) 25 MCG (1000 UT) CAPS, Take 1,000 Units by mouth daily. , Disp: , Rfl:    Coenzyme Q10 (CO Q-10) 200 MG CAPS, Take 200 mg by mouth daily. , Disp: , Rfl:    ezetimibe (ZETIA) 10 MG tablet, Take 10 mg by mouth daily., Disp: , Rfl:    levothyroxine (SYNTHROID) 112 MCG tablet, Take 112 mcg by mouth daily before breakfast., Disp: , Rfl:    metFORMIN (GLUCOPHAGE) 500 MG tablet, Take 1,000 mg by mouth daily., Disp: , Rfl:    Multiple Vitamins-Minerals (ONE-A-DAY WOMENS 50 PLUS PO), Take 1 tablet by mouth daily., Disp: , Rfl:    omeprazole (PRILOSEC) 20 MG capsule, Take 20 mg by mouth every other day., Disp: , Rfl:    PROLIA 60 MG/ML SOSY injection, Inject 60 mg into the muscle every 6 (six) months., Disp: , Rfl: 1   "

## 2024-07-13 DIAGNOSIS — M810 Age-related osteoporosis without current pathological fracture: Secondary | ICD-10-CM | POA: Diagnosis not present

## 2024-08-09 ENCOUNTER — Encounter: Payer: Self-pay | Admitting: *Deleted

## 2024-08-09 DIAGNOSIS — Z006 Encounter for examination for normal comparison and control in clinical research program: Secondary | ICD-10-CM

## 2024-08-09 NOTE — Research (Signed)
 Spoke with Alison [name] about her Lpa results. Scheduled her main screening appointment for 08-10-2024 at 0800.

## 2024-08-10 ENCOUNTER — Encounter: Admitting: *Deleted

## 2024-08-10 VITALS — BP 127/65 | HR 67 | Temp 98.1°F | Resp 16 | Ht 61.5 in | Wt 136.0 lb

## 2024-08-10 DIAGNOSIS — Z006 Encounter for examination for normal comparison and control in clinical research program: Secondary | ICD-10-CM

## 2024-08-10 NOTE — Research (Addendum)
 "                      Chemistry: Glucose 107    mg/dL                                       [] Clinically Significant  [x] Not Clinically Significant   Hematology: Hemoglobin   g/dL           [] Clinically Significant  [x] Not Clinically Significant [facility]   pg                         [] Clinically Significant  [x] Not Clinically Significant [facility]   g/dL                    [] Clinically Significant  [x] Not Clinically Significant  Urinalysis: Glucose       mg/dL            [] Clinically Significant  [x] Not Clinically Significant  Urine Chemistry: Protein Creatinine Ratio mg/g           [] Clinically Significant  [x] Not Clinically Significant Urine Albumin mg/dL                         [] Clinically Significant  [x] Not Clinically Significant  Lipids:  Cholesterol 150 mg/dL                        [] Clinically Significant  [x] Not Clinically Significant  Coagulation:  PT 9.6                                     [] Clinically Significant  [x] Not Clinically  Significant                                  Any further action needed to be taken per the PI? No  Alison KYM Maxcy, MD, [facility], FNLA, FACP  [place]  [facility]  Medical Director of the Advanced Lipid Disorders &  Cardiovascular Risk Reduction Clinic Diplomate of the American Board of Clinical Lipidology Attending Cardiologist  Direct Dial: [phone]  Fax: [phone]  Website:  [email or web address]     Subject Name: Alison [name] [facility]  Subject met inclusion and exclusion criteria.  The informed consent form, study requirements and expectations were reviewed with the subject and questions and concerns were addressed prior to the signing of the consent form.  The subject verbalized understanding of the trial requirements.  The subject agreed to participate in the pre-event  trial and signed the informed consent at 0832 on 08-10-24.  The informed consent was obtained prior to performance of any protocol-specific  procedures for the subject.  A copy of the signed informed consent was given to the subject and a copy was placed in the subject's medical record.   Alison [name]    Pre-Event Main Screening  Date of Visit: 10-Aug-2024                         Subject Number: [id number]  During this visit the following activities were completed:  [x]  Reading, Signing and Understanding the informed Consent   [x]  Medical History                                          [  x] Smoking History (Prior, or concurrent use)  none                                 [x]  Vital signs     Arm:    right                          [x]  Adverse events      BP: 127/65      HR:   67                                                     [x]  Serious adverse device effects      Resp:  16                                                                                     Temp:    36.7                                              [x]  Concomitant therapies reviewed      O2 Sat:98%      Height:  5 ft 1.5 in                                                     Weight:136 lbs      Waist circumference: 35.5 in   [x]  Central Labs        []  Urine Preg test        []  Serum Preg test        []  FSH (only if needed to determine childbearing potential)         [x]  Fasting glucose         [x]  HbA1c        [x]  Fasting lipid panel & apolipoproteins        [x]  Coag        [x]  Chemistry        [x]  Hematology        [x]  Hs-CRP  Alison [name] is here for pre-event main screening.  She was placed in a quiet room, given time to read over the consent, and ask questions. Vs taken at 0833. Blood drawn at 0834. Informed her I will call her to schedule next visit if she meets inclusion/ exclusion. No visits to the Ed or urgent care. She states she now takes Nutrafol instead of Biotin now.( This was changed in Sept 2025) States she has never smoked.   Current Medications[1]       [1]  Current Outpatient Medications:    amLODipine -benazepril   (LOTREL) 5-10 MG per capsule, Take 1  capsule by mouth every other day. , Disp: , Rfl:    atorvastatin  (LIPITOR) 20 MG tablet, Take 20 mg by mouth daily. (Patient taking differently: Take 20 mg by mouth daily. Takes 40 mg daily per-pt), Disp: , Rfl:    buPROPion  (WELLBUTRIN  XL) 300 MG 24 hr tablet, Take 300 mg by mouth daily., Disp: , Rfl:    Cholecalciferol (VITAMIN D-3) 25 MCG (1000 UT) CAPS, Take 1,000 Units by mouth daily. , Disp: , Rfl:    Coenzyme Q10 (CO Q-10) 200 MG CAPS, Take 200 mg by mouth daily. , Disp: , Rfl:    ezetimibe (ZETIA) 10 MG tablet, Take 10 mg by mouth daily., Disp: , Rfl:    levothyroxine (SYNTHROID) 112 MCG tablet, Take 112 mcg by mouth daily before breakfast., Disp: , Rfl:    metFORMIN (GLUCOPHAGE) 500 MG tablet, Take 1,000 mg by mouth daily., Disp: , Rfl:    Multiple Vitamins-Minerals (ONE-A-DAY WOMENS 50 PLUS PO), Take 1 tablet by mouth daily., Disp: , Rfl:    omeprazole (PRILOSEC) 20 MG capsule, Take 20 mg by mouth every other day., Disp: , Rfl:    Biotin 1000 MCG tablet, Take 1,000 mcg by mouth every other day.  (Patient not taking: Reported on 08/10/2024), Disp: , Rfl:    PROLIA 60 MG/ML SOSY injection, Inject 60 mg into the muscle every 6 (six) months. (Patient not taking: Reported on 08/10/2024), Disp: , Rfl: 1  "

## 2024-08-16 ENCOUNTER — Encounter: Payer: Self-pay | Admitting: *Deleted

## 2024-08-16 DIAGNOSIS — Z006 Encounter for examination for normal comparison and control in clinical research program: Secondary | ICD-10-CM

## 2024-08-16 NOTE — Research (Signed)
 I spoke with Ms Alison [name] about her family history. She states her mother was not on anything for her cholesterol and never has any heart problems. She states her father had heart stents placed and heart valve surgery in his 42's. Ms Alison [name] had 3 risk factors for pre-event so she was brought in for main screening to assess her CRP results. These were normal, so she is a screen fail for pre-event research study. Ms Alison [name] informed.

## 2024-09-10 ENCOUNTER — Other Ambulatory Visit (HOSPITAL_COMMUNITY): Payer: Self-pay | Admitting: Obstetrics & Gynecology

## 2024-09-10 ENCOUNTER — Telehealth (HOSPITAL_COMMUNITY): Payer: Self-pay | Admitting: Pharmacy Technician

## 2024-09-10 NOTE — Progress Notes (Signed)
 Received Reclast referral (new start) Calcium  and renal function wnl 09/09/24 (attached to referral)  Worsening T-score on Prolia  Cori Henningsen, PharmD, MPH, BCPS, CPP Clinical Pharmacist

## 2024-09-10 NOTE — Telephone Encounter (Signed)
 Auth Submission: NO AUTH NEEDED Site of care: [facility] Payer: [employer] Medication & CPT/J Code(s) submitted: Reclast (Zolendronic acid) J3489 Diagnosis Code: M81.0 Route of submission (phone, fax, portal):  Phone # Fax # Auth type: Buy/Bill HB Units/visits requested: 5mg  x 1 dose, every 12 months Reference number:  Approval from: 09/10/2024 to 08/11/25    Dagoberto Armour, CPhT [facility] Phone: [phone] 09/10/2024

## 2024-09-27 ENCOUNTER — Encounter (HOSPITAL_COMMUNITY)

## 2025-05-30 ENCOUNTER — Telehealth: Admitting: Family Medicine
